# Patient Record
Sex: Male | Born: 1955 | Race: White | Hispanic: No | Marital: Single | Smoking: Former smoker
Health system: Southern US, Community
[De-identification: ages and names within clinical notes are randomized; demographics above are authoritative.]

## PROBLEM LIST (undated history)

## (undated) DIAGNOSIS — C801 Malignant (primary) neoplasm, unspecified: Secondary | ICD-10-CM

## (undated) DIAGNOSIS — K7460 Unspecified cirrhosis of liver: Secondary | ICD-10-CM

## (undated) DIAGNOSIS — C169 Malignant neoplasm of stomach, unspecified: Secondary | ICD-10-CM

## (undated) DIAGNOSIS — C229 Malignant neoplasm of liver, not specified as primary or secondary: Secondary | ICD-10-CM

## (undated) DIAGNOSIS — B1920 Unspecified viral hepatitis C without hepatic coma: Secondary | ICD-10-CM

## (undated) HISTORY — PX: LIVER BIOPSY: SHX301

---

## 2013-09-29 ENCOUNTER — Emergency Department (HOSPITAL_COMMUNITY): Payer: Medicaid Other

## 2013-09-29 ENCOUNTER — Encounter (HOSPITAL_COMMUNITY): Payer: Self-pay | Admitting: Emergency Medicine

## 2013-09-29 ENCOUNTER — Inpatient Hospital Stay (HOSPITAL_COMMUNITY)
Admission: EM | Admit: 2013-09-29 | Discharge: 2013-10-21 | DRG: 871 | Disposition: E | Payer: Medicaid Other | Attending: Internal Medicine | Admitting: Internal Medicine

## 2013-09-29 DIAGNOSIS — R740 Nonspecific elevation of levels of transaminase and lactic acid dehydrogenase [LDH]: Secondary | ICD-10-CM

## 2013-09-29 DIAGNOSIS — C7800 Secondary malignant neoplasm of unspecified lung: Secondary | ICD-10-CM | POA: Diagnosis present

## 2013-09-29 DIAGNOSIS — C23 Malignant neoplasm of gallbladder: Secondary | ICD-10-CM | POA: Diagnosis present

## 2013-09-29 DIAGNOSIS — R6521 Severe sepsis with septic shock: Secondary | ICD-10-CM

## 2013-09-29 DIAGNOSIS — E872 Acidosis, unspecified: Secondary | ICD-10-CM | POA: Diagnosis present

## 2013-09-29 DIAGNOSIS — C801 Malignant (primary) neoplasm, unspecified: Secondary | ICD-10-CM

## 2013-09-29 DIAGNOSIS — A419 Sepsis, unspecified organism: Principal | ICD-10-CM | POA: Diagnosis present

## 2013-09-29 DIAGNOSIS — B1921 Unspecified viral hepatitis C with hepatic coma: Secondary | ICD-10-CM | POA: Diagnosis present

## 2013-09-29 DIAGNOSIS — R188 Other ascites: Secondary | ICD-10-CM | POA: Diagnosis present

## 2013-09-29 DIAGNOSIS — Z6825 Body mass index (BMI) 25.0-25.9, adult: Secondary | ICD-10-CM

## 2013-09-29 DIAGNOSIS — J9600 Acute respiratory failure, unspecified whether with hypoxia or hypercapnia: Secondary | ICD-10-CM | POA: Diagnosis present

## 2013-09-29 DIAGNOSIS — D696 Thrombocytopenia, unspecified: Secondary | ICD-10-CM | POA: Diagnosis present

## 2013-09-29 DIAGNOSIS — E875 Hyperkalemia: Secondary | ICD-10-CM

## 2013-09-29 DIAGNOSIS — R5381 Other malaise: Secondary | ICD-10-CM

## 2013-09-29 DIAGNOSIS — Z87891 Personal history of nicotine dependence: Secondary | ICD-10-CM | POA: Diagnosis not present

## 2013-09-29 DIAGNOSIS — R627 Adult failure to thrive: Secondary | ICD-10-CM | POA: Diagnosis present

## 2013-09-29 DIAGNOSIS — R5383 Other fatigue: Secondary | ICD-10-CM

## 2013-09-29 DIAGNOSIS — Z515 Encounter for palliative care: Secondary | ICD-10-CM

## 2013-09-29 DIAGNOSIS — C7931 Secondary malignant neoplasm of brain: Secondary | ICD-10-CM | POA: Diagnosis present

## 2013-09-29 DIAGNOSIS — K7460 Unspecified cirrhosis of liver: Secondary | ICD-10-CM | POA: Diagnosis present

## 2013-09-29 DIAGNOSIS — E43 Unspecified severe protein-calorie malnutrition: Secondary | ICD-10-CM | POA: Diagnosis present

## 2013-09-29 DIAGNOSIS — C799 Secondary malignant neoplasm of unspecified site: Secondary | ICD-10-CM

## 2013-09-29 DIAGNOSIS — I214 Non-ST elevation (NSTEMI) myocardial infarction: Secondary | ICD-10-CM | POA: Diagnosis present

## 2013-09-29 DIAGNOSIS — C787 Secondary malignant neoplasm of liver and intrahepatic bile duct: Secondary | ICD-10-CM | POA: Diagnosis present

## 2013-09-29 DIAGNOSIS — K769 Liver disease, unspecified: Secondary | ICD-10-CM

## 2013-09-29 DIAGNOSIS — K7200 Acute and subacute hepatic failure without coma: Secondary | ICD-10-CM | POA: Diagnosis present

## 2013-09-29 DIAGNOSIS — N179 Acute kidney failure, unspecified: Secondary | ICD-10-CM | POA: Diagnosis present

## 2013-09-29 DIAGNOSIS — K7290 Hepatic failure, unspecified without coma: Secondary | ICD-10-CM

## 2013-09-29 DIAGNOSIS — R7401 Elevation of levels of liver transaminase levels: Secondary | ICD-10-CM

## 2013-09-29 DIAGNOSIS — E162 Hypoglycemia, unspecified: Secondary | ICD-10-CM | POA: Diagnosis present

## 2013-09-29 DIAGNOSIS — C7949 Secondary malignant neoplasm of other parts of nervous system: Secondary | ICD-10-CM

## 2013-09-29 DIAGNOSIS — R6520 Severe sepsis without septic shock: Secondary | ICD-10-CM

## 2013-09-29 DIAGNOSIS — Z66 Do not resuscitate: Secondary | ICD-10-CM | POA: Diagnosis present

## 2013-09-29 DIAGNOSIS — J9601 Acute respiratory failure with hypoxia: Secondary | ICD-10-CM | POA: Diagnosis present

## 2013-09-29 DIAGNOSIS — R0602 Shortness of breath: Secondary | ICD-10-CM | POA: Diagnosis present

## 2013-09-29 DIAGNOSIS — R531 Weakness: Secondary | ICD-10-CM

## 2013-09-29 HISTORY — DX: Unspecified viral hepatitis C without hepatic coma: B19.20

## 2013-09-29 HISTORY — DX: Malignant neoplasm of stomach, unspecified: C16.9

## 2013-09-29 HISTORY — DX: Malignant (primary) neoplasm, unspecified: C80.1

## 2013-09-29 HISTORY — DX: Unspecified cirrhosis of liver: K74.60

## 2013-09-29 HISTORY — DX: Malignant neoplasm of liver, not specified as primary or secondary: C22.9

## 2013-09-29 LAB — CBC WITH DIFFERENTIAL/PLATELET
BASOS PCT: 1 % (ref 0–1)
Basophils Absolute: 0.1 10*3/uL (ref 0.0–0.1)
EOS PCT: 0 % (ref 0–5)
Eosinophils Absolute: 0 10*3/uL (ref 0.0–0.7)
HCT: 38.5 % — ABNORMAL LOW (ref 39.0–52.0)
HEMOGLOBIN: 12.9 g/dL — AB (ref 13.0–17.0)
LYMPHS PCT: 6 % — AB (ref 12–46)
Lymphs Abs: 0.7 10*3/uL (ref 0.7–4.0)
MCH: 38.1 pg — ABNORMAL HIGH (ref 26.0–34.0)
MCHC: 33.5 g/dL (ref 30.0–36.0)
MCV: 113.6 fL — ABNORMAL HIGH (ref 78.0–100.0)
MONOS PCT: 14 % — AB (ref 3–12)
Monocytes Absolute: 1.7 10*3/uL — ABNORMAL HIGH (ref 0.1–1.0)
NEUTROS PCT: 79 % — AB (ref 43–77)
Neutro Abs: 9.3 10*3/uL — ABNORMAL HIGH (ref 1.7–7.7)
Platelets: 57 10*3/uL — ABNORMAL LOW (ref 150–400)
RBC: 3.39 MIL/uL — ABNORMAL LOW (ref 4.22–5.81)
RDW: 19.3 % — ABNORMAL HIGH (ref 11.5–15.5)
WBC: 11.8 10*3/uL — AB (ref 4.0–10.5)

## 2013-09-29 LAB — BODY FLUID CELL COUNT WITH DIFFERENTIAL
Eos, Fluid: 0 %
LYMPHS FL: 34 %
MONOCYTE-MACROPHAGE-SEROUS FLUID: 63 % (ref 50–90)
Neutrophil Count, Fluid: 3 % (ref 0–25)
WBC FLUID: 422 uL (ref 0–1000)

## 2013-09-29 LAB — CBC
HEMATOCRIT: 32.7 % — AB (ref 39.0–52.0)
Hemoglobin: 10.8 g/dL — ABNORMAL LOW (ref 13.0–17.0)
MCH: 37.9 pg — AB (ref 26.0–34.0)
MCHC: 33 g/dL (ref 30.0–36.0)
MCV: 114.7 fL — ABNORMAL HIGH (ref 78.0–100.0)
Platelets: 34 10*3/uL — ABNORMAL LOW (ref 150–400)
RBC: 2.85 MIL/uL — ABNORMAL LOW (ref 4.22–5.81)
RDW: 19.4 % — ABNORMAL HIGH (ref 11.5–15.5)
WBC: 11 10*3/uL — AB (ref 4.0–10.5)

## 2013-09-29 LAB — COMPREHENSIVE METABOLIC PANEL
ALBUMIN: 2.5 g/dL — AB (ref 3.5–5.2)
ALK PHOS: 147 U/L — AB (ref 39–117)
ALT: 503 U/L — AB (ref 0–53)
ALT: 595 U/L — AB (ref 0–53)
AST: 1437 U/L — AB (ref 0–37)
AST: 1866 U/L — ABNORMAL HIGH (ref 0–37)
Albumin: 3.1 g/dL — ABNORMAL LOW (ref 3.5–5.2)
Alkaline Phosphatase: 194 U/L — ABNORMAL HIGH (ref 39–117)
Anion gap: 33 — ABNORMAL HIGH (ref 5–15)
Anion gap: 32 — ABNORMAL HIGH (ref 5–15)
BUN: 61 mg/dL — ABNORMAL HIGH (ref 6–23)
BUN: 60 mg/dL — ABNORMAL HIGH (ref 6–23)
CHLORIDE: 85 meq/L — AB (ref 96–112)
CO2: 14 meq/L — AB (ref 19–32)
CO2: 12 mEq/L — ABNORMAL LOW (ref 19–32)
Calcium: 10.2 mg/dL (ref 8.4–10.5)
Calcium: 9.7 mg/dL (ref 8.4–10.5)
Chloride: 89 mEq/L — ABNORMAL LOW (ref 96–112)
Creatinine, Ser: 1.96 mg/dL — ABNORMAL HIGH (ref 0.50–1.35)
Creatinine, Ser: 2.06 mg/dL — ABNORMAL HIGH (ref 0.50–1.35)
GFR calc Af Amer: 42 mL/min — ABNORMAL LOW (ref >90)
GFR, EST AFRICAN AMERICAN: 40 mL/min — AB (ref >90)
GFR, EST NON AFRICAN AMERICAN: 36 mL/min — AB (ref >90)
GFR, EST NON AFRICAN AMERICAN: 34 mL/min — AB (ref >90)
GLUCOSE: 106 mg/dL — AB (ref 70–99)
Glucose, Bld: 32 mg/dL — CL (ref 70–99)
POTASSIUM: 6.7 meq/L — AB (ref 3.7–5.3)
Potassium: 6.9 mEq/L (ref 3.7–5.3)
SODIUM: 132 meq/L — AB (ref 137–147)
SODIUM: 133 meq/L — AB (ref 137–147)
Total Bilirubin: 18.3 mg/dL (ref 0.3–1.2)
Total Bilirubin: 17.8 mg/dL — ABNORMAL HIGH (ref 0.3–1.2)
Total Protein: 7.6 g/dL (ref 6.0–8.3)
Total Protein: 6.8 g/dL (ref 6.0–8.3)

## 2013-09-29 LAB — PROTIME-INR
INR: 4.51 — ABNORMAL HIGH (ref 0.00–1.49)
Prothrombin Time: 42.8 seconds — ABNORMAL HIGH (ref 11.6–15.2)

## 2013-09-29 LAB — BASIC METABOLIC PANEL
Anion gap: 24 — ABNORMAL HIGH (ref 5–15)
BUN: 61 mg/dL — AB (ref 6–23)
CO2: 18 mEq/L — ABNORMAL LOW (ref 19–32)
Calcium: 9.8 mg/dL (ref 8.4–10.5)
Chloride: 89 mEq/L — ABNORMAL LOW (ref 96–112)
Creatinine, Ser: 2.3 mg/dL — ABNORMAL HIGH (ref 0.50–1.35)
GFR calc Af Amer: 35 mL/min — ABNORMAL LOW (ref >90)
GFR, EST NON AFRICAN AMERICAN: 30 mL/min — AB (ref >90)
Glucose, Bld: 156 mg/dL — ABNORMAL HIGH (ref 70–99)
POTASSIUM: 6 meq/L — AB (ref 3.7–5.3)
SODIUM: 131 meq/L — AB (ref 137–147)

## 2013-09-29 LAB — BLOOD GAS, ARTERIAL
Acid-base deficit: 12.1 mmol/L — ABNORMAL HIGH (ref 0.0–2.0)
BICARBONATE: 12.7 meq/L — AB (ref 20.0–24.0)
DRAWN BY: 36496
FIO2: 1 %
O2 SAT: 100 %
PCO2 ART: 26.3 mmHg — AB (ref 35.0–45.0)
PO2 ART: 326 mmHg — AB (ref 80.0–100.0)
Patient temperature: 98.6
TCO2: 11.8 mmol/L (ref 0–100)
pH, Arterial: 7.303 — ABNORMAL LOW (ref 7.350–7.450)

## 2013-09-29 LAB — ALBUMIN, FLUID (OTHER): Albumin, Fluid: 0.4 g/dL

## 2013-09-29 LAB — TROPONIN I
Troponin I: 0.63 ng/mL (ref <0.30)
Troponin I: 1.24 ng/mL (ref <0.30)
Troponin I: 1.49 ng/mL (ref <0.30)

## 2013-09-29 LAB — MRSA PCR SCREENING: MRSA by PCR: NEGATIVE

## 2013-09-29 LAB — PHOSPHORUS: Phosphorus: 7 mg/dL — ABNORMAL HIGH (ref 2.3–4.6)

## 2013-09-29 LAB — CBG MONITORING, ED: GLUCOSE-CAPILLARY: 140 mg/dL — AB (ref 70–99)

## 2013-09-29 LAB — GLUCOSE, CAPILLARY
GLUCOSE-CAPILLARY: 108 mg/dL — AB (ref 70–99)
GLUCOSE-CAPILLARY: 191 mg/dL — AB (ref 70–99)
Glucose-Capillary: 135 mg/dL — ABNORMAL HIGH (ref 70–99)
Glucose-Capillary: 159 mg/dL — ABNORMAL HIGH (ref 70–99)

## 2013-09-29 LAB — PROTEIN, BODY FLUID: Total protein, fluid: 0.8 g/dL

## 2013-09-29 LAB — HEMATOCRIT, BODY FLUID: Hematocrit, Fluid: 3 %

## 2013-09-29 LAB — TYPE AND SCREEN
ABO/RH(D): A NEG
Antibody Screen: NEGATIVE

## 2013-09-29 LAB — AMMONIA: Ammonia: 82 umol/L — ABNORMAL HIGH (ref 11–60)

## 2013-09-29 LAB — I-STAT CG4 LACTIC ACID, ED: LACTIC ACID, VENOUS: 16.75 mmol/L — AB (ref 0.5–2.2)

## 2013-09-29 LAB — ABO/RH: ABO/RH(D): A NEG

## 2013-09-29 LAB — MAGNESIUM: Magnesium: 3.3 mg/dL — ABNORMAL HIGH (ref 1.5–2.5)

## 2013-09-29 LAB — LACTIC ACID, PLASMA: Lactic Acid, Venous: 11.1 mmol/L — ABNORMAL HIGH (ref 0.5–2.2)

## 2013-09-29 MED ORDER — ONDANSETRON HCL 4 MG/2ML IJ SOLN
4.0000 mg | Freq: Four times a day (QID) | INTRAMUSCULAR | Status: DC | PRN
  Administered 2013-09-29: 4 mg via INTRAVENOUS
  Filled 2013-09-29: qty 2

## 2013-09-29 MED ORDER — LACTULOSE 10 GM/15ML PO SOLN
20.0000 g | Freq: Two times a day (BID) | ORAL | Status: DC
  Administered 2013-09-29: 20 g via ORAL
  Filled 2013-09-29: qty 30

## 2013-09-29 MED ORDER — SODIUM CHLORIDE 0.9 % IV SOLN: 250.0000 mL | INTRAVENOUS | Status: DC | PRN

## 2013-09-29 MED ORDER — VANCOMYCIN HCL IN DEXTROSE 1-5 GM/200ML-% IV SOLN
1000.0000 mg | Freq: Once | INTRAVENOUS | Status: AC
  Administered 2013-09-29: 1000 mg via INTRAVENOUS
  Filled 2013-09-29: qty 200

## 2013-09-29 MED ORDER — INSULIN ASPART 100 UNIT/ML ~~LOC~~ SOLN
1.0000 [IU] | SUBCUTANEOUS | Status: DC
  Administered 2013-09-29 (×2): 2 [IU] via SUBCUTANEOUS
  Administered 2013-09-29: 1 [IU] via SUBCUTANEOUS
  Administered 2013-09-29: 2 [IU] via SUBCUTANEOUS
  Administered 2013-09-30 (×2): 1 [IU] via SUBCUTANEOUS
  Administered 2013-09-30: 2 [IU] via SUBCUTANEOUS

## 2013-09-29 MED ORDER — ENSURE COMPLETE PO LIQD
237.0000 mL | Freq: Three times a day (TID) | ORAL | Status: DC
  Administered 2013-09-29 – 2013-09-30 (×5): 237 mL via ORAL

## 2013-09-29 MED ORDER — PIPERACILLIN-TAZOBACTAM 3.375 G IVPB 30 MIN
3.3750 g | Freq: Once | INTRAVENOUS | Status: AC
  Administered 2013-09-29: 3.375 g via INTRAVENOUS
  Filled 2013-09-29: qty 50

## 2013-09-29 MED ORDER — CETYLPYRIDINIUM CHLORIDE 0.05 % MT LIQD
7.0000 mL | Freq: Two times a day (BID) | OROMUCOSAL | Status: DC
  Administered 2013-09-29 – 2013-09-30 (×3): 7 mL via OROMUCOSAL

## 2013-09-29 MED ORDER — SODIUM CHLORIDE 0.9 % IV BOLUS (SEPSIS)
2000.0000 mL | Freq: Once | INTRAVENOUS | Status: AC
  Administered 2013-09-29: 2000 mL via INTRAVENOUS

## 2013-09-29 MED ORDER — SODIUM CHLORIDE 0.9 % IV BOLUS (SEPSIS)
30.0000 mL/kg | Freq: Once | INTRAVENOUS | Status: AC
  Administered 2013-09-29: 1000 mL via INTRAVENOUS

## 2013-09-29 MED ORDER — DEXTROSE 50 % IV SOLN
1.0000 | Freq: Once | INTRAVENOUS | Status: AC
  Administered 2013-09-29: 50 mL via INTRAVENOUS

## 2013-09-29 MED ORDER — FENTANYL CITRATE 0.05 MG/ML IJ SOLN
25.0000 ug | INTRAMUSCULAR | Status: DC | PRN
  Administered 2013-09-30: 25 ug via INTRAVENOUS
  Filled 2013-09-29: qty 2

## 2013-09-29 MED ORDER — SODIUM CHLORIDE 0.9 % IV SOLN
1.0000 g | Freq: Once | INTRAVENOUS | Status: AC
  Administered 2013-09-29: 1 g via INTRAVENOUS
  Filled 2013-09-29: qty 10

## 2013-09-29 MED ORDER — HYDROMORPHONE HCL PF 1 MG/ML IJ SOLN: 1.0000 mg | INTRAMUSCULAR | Status: DC | PRN

## 2013-09-29 MED ORDER — SODIUM CHLORIDE 0.9 % IJ SOLN
10.0000 mL | Freq: Two times a day (BID) | INTRAMUSCULAR | Status: DC
  Administered 2013-09-29 – 2013-09-30 (×3): 10 mL

## 2013-09-29 MED ORDER — SODIUM CHLORIDE 0.9 % IJ SOLN: 10.0000 mL | INTRAMUSCULAR | Status: DC | PRN

## 2013-09-29 MED ORDER — INSULIN ASPART 100 UNIT/ML ~~LOC~~ SOLN
10.0000 [IU] | Freq: Once | SUBCUTANEOUS | Status: AC
  Administered 2013-09-29: 10 [IU] via INTRAVENOUS
  Filled 2013-09-29: qty 1

## 2013-09-29 MED ORDER — SODIUM CHLORIDE 0.9 % IV SOLN
1.0000 g | Freq: Two times a day (BID) | INTRAVENOUS | Status: DC
  Administered 2013-09-29 – 2013-10-01 (×5): 1 g via INTRAVENOUS
  Filled 2013-09-29 (×5): qty 1

## 2013-09-29 MED ORDER — DEXTROSE-NACL 5-0.9 % IV SOLN
INTRAVENOUS | Status: DC
  Administered 2013-09-29 – 2013-09-30 (×4): via INTRAVENOUS

## 2013-09-29 MED ORDER — SODIUM CHLORIDE 0.9 % IV SOLN: 1000.0000 mL | INTRAVENOUS | Status: DC

## 2013-09-29 MED ORDER — PIPERACILLIN-TAZOBACTAM 3.375 G IVPB: 3.3750 g | Freq: Three times a day (TID) | INTRAVENOUS | Status: DC

## 2013-09-29 MED ORDER — LACTULOSE 10 GM/15ML PO SOLN
20.0000 g | Freq: Every day | ORAL | Status: DC
  Administered 2013-09-30: 20 g via ORAL
  Filled 2013-09-29 (×2): qty 30

## 2013-09-29 MED ORDER — PANTOPRAZOLE SODIUM 40 MG IV SOLR
40.0000 mg | INTRAVENOUS | Status: DC
  Administered 2013-09-29: 40 mg via INTRAVENOUS
  Filled 2013-09-29: qty 40

## 2013-09-29 MED ORDER — SODIUM CHLORIDE 0.9 % IV SOLN
10.0000 mL/h | Freq: Once | INTRAVENOUS | Status: AC
  Administered 2013-09-29: 10 mL/h via INTRAVENOUS

## 2013-09-29 MED ORDER — CHLORHEXIDINE GLUCONATE 0.12 % MT SOLN
15.0000 mL | Freq: Two times a day (BID) | OROMUCOSAL | Status: DC
  Administered 2013-09-29 – 2013-09-30 (×4): 15 mL via OROMUCOSAL
  Filled 2013-09-29 (×7): qty 15

## 2013-09-29 MED ORDER — SODIUM POLYSTYRENE SULFONATE 15 GM/60ML PO SUSP
60.0000 g | Freq: Once | ORAL | Status: AC
  Administered 2013-09-29: 60 g via ORAL
  Filled 2013-09-29: qty 240

## 2013-09-29 MED ORDER — ALBUMIN HUMAN 25 % IV SOLN
50.0000 g | Freq: Once | INTRAVENOUS | Status: AC
  Administered 2013-09-29: 50 g via INTRAVENOUS
  Filled 2013-09-29: qty 200

## 2013-09-29 MED ORDER — VANCOMYCIN HCL IN DEXTROSE 750-5 MG/150ML-% IV SOLN: 750.0000 mg | Freq: Two times a day (BID) | INTRAVENOUS | Status: DC

## 2013-09-29 MED ORDER — MORPHINE SULFATE 2 MG/ML IJ SOLN
2.0000 mg | Freq: Once | INTRAMUSCULAR | Status: AC
  Administered 2013-09-29: 2 mg via INTRAVENOUS
  Filled 2013-09-29: qty 1

## 2013-09-29 MED ORDER — DEXTROSE 50 % IV SOLN
INTRAVENOUS | Status: AC
  Filled 2013-09-29: qty 50

## 2013-09-29 MED ORDER — OXYCODONE HCL 5 MG PO TABS
10.0000 mg | ORAL_TABLET | ORAL | Status: DC | PRN
  Administered 2013-09-29: 10 mg via ORAL
  Filled 2013-09-29 (×3): qty 2

## 2013-09-29 NOTE — Progress Notes (Addendum)
ANTIBIOTIC CONSULT NOTE - INITIAL  Pharmacy Consult for Zosyn and Vancomycin x1 Ed Meropenem Indication: Intra-abdominal infection  No Known Allergies  Patient Measurements: Height: 5' 10.5" (179.1 cm) Weight: 160 lb (72.576 kg) IBW/kg (Calculated) : 74.15   Vital Signs: BP: 102/61 mmHg (08/10 0215) Pulse Rate: 95 (08/10 0215) Intake/Output from previous day:   Intake/Output from this shift:    Labs:  Recent Labs  09/25/2013 0112  WBC 11.8*  HGB 12.9*  PLT 57*  CREATININE 1.96*   Estimated Creatinine Clearance: 43.2 ml/min (by C-G formula based on Cr of 1.96). No results found for this basename: VANCOTROUGH, VANCOPEAK, VANCORANDOM, GENTTROUGH, GENTPEAK, GENTRANDOM, TOBRATROUGH, TOBRAPEAK, TOBRARND, AMIKACINPEAK, AMIKACINTROU, AMIKACIN,  in the last 72 hours   Microbiology: No results found for this or any previous visit (from the past 720 hour(s)).  Medical History: Past Medical History  Diagnosis Date  . Cancer   . Cirrhosis of liver   . Hepatitis C   . Liver cancer     Stage IV  . 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 dextrose      . vancomycin  750 mg Intravenous Q12H   Infusions:  . calcium gluconate 1 GM IV 1 g (09/26/2013 0250)  . dextrose 5 % and 0.9% NaCl 125 mL/hr at 10/12/2013 0237  . piperacillin-tazobactam (ZOSYN)  IV     Assessment: 58 yo with hx of cirrhosis, hep C and recent diagnosis of stage IV Ca with mets to liver. Admitted with respiratory distress.  Zosyn and Vancomycin x1 Ed. Meropenem per Rx for intra-abdominal infection.  Goal of Therapy:  Treat infection  Plan:   Meropenem 1Gm IV q12h  F/U SCr/levels/cultures as needed  Lawana Pai R 10/14/2013,2:51 AM

## 2013-09-29 NOTE — Progress Notes (Signed)
eLink Physician-Brief Progress Note Patient Name: Todd Mcdowell DOB: 06/27/1955 MRN: [id number]  Date of Service  09/22/2013   HPI/Events of Note  Persistent hyperkalemia with K of 6.7 despite calcium gluconate/insulin/dextrose   eICU Interventions  Plan 60 gm kayexalate po times one dose   Intervention Category Major Interventions: Electrolyte abnormality - evaluation and management  DETERDING,ELIZABETH 10/07/2013, 6:24 AM

## 2013-09-29 NOTE — ED Notes (Signed)
Bed: RESB Expected date:  Expected time:  Means of arrival:  Comments: EMS/57 yo male with liver cancer with mets to lungs-78% NRB

## 2013-09-29 NOTE — Procedures (Signed)
Parecentesis Procedure Note  Pre-operative Diagnosis: Large ascites  Post-operative Diagnosis: Same  Indications: Large ascites causing dyspnea  Procedure Details  Consent: Informed consent was obtained. Risks of the procedure were discussed including: infection, bleeding, pain, puncture of intraabdominal organ  Under sterile conditions the patient was positioned. Betadine solution and sterile drapes were utilized.  1% buffered lidocaine was used to anesthetize the RLQ abdominal area. Fluid was obtained without any difficulties and minimal blood loss.  A dressing was applied to the wound and wound care instructions were provided.   Findings 3400 ml of bloody peritoneal fluid was obtained. A sample was sent to Pathology for cytogenetics, flow, and cell counts, as well as for infection analysis.  Complications:  None; patient tolerated the procedure well.          Condition: stable  Plan Follow up fluid analysis Give FFP and albumin

## 2013-09-29 NOTE — Progress Notes (Signed)
INITIAL NUTRITION ASSESSMENT  Pt meets criteria for severe MALNUTRITION in the context of chronic illness as evidenced by severe wasting in upper body and severe fluid accumulation.  DOCUMENTATION CODES Per approved criteria  -Severe malnutrition in the context of chronic illness    INTERVENTION: - Ensure Complete TID - Ordered snacks per pt preferences - RD to monitor plan of care  NUTRITION DIAGNOSIS: Inadequate oral intake related to poor appetite as evidenced by son's report.   Goal: Nutrition per goals of palliative care discussion  Monitor:  Weights, labs, intake, goals of care  Reason for Assessment: Malnutrition screening tool   58 y.o. male  Admitting Dx: Dyspnea   ASSESSMENT: Pt with history of cirrhosis, hepatitis C, and recent diagnosis of stage IV cancer with metastases to liver and lungs and suspicion of either brain mets vs embolic CVA who presented with respiratory distress. Pt jaundiced and some wasting in upper body. Palliative care has been consulted.   - Pt kept eyes closed during most of visit - Son and multiple family members present at bedside - Son reports pt has had poor appetite for the past 2-3 days due to having a lot of fluid on him - Usually eats frosted flakes for breakfast, a sandwich for lunch, and snacks on a 200 calorie yogurt with added vitamins and minerals in addition to Ensure - Son states pt ate a good a breakfast - Pt appeared too lethargic for nutrition focused physical exam  Potassium elevated BUN/Cr elevated with low GFR Alk phos elevated Phosphorus and magnesium elevated  Ammonia and total bilirubin elevated AST/ALT significantly elevated    Height: Ht Readings from Last 1 Encounters:  10/07/2013 $RemoveB'5\' 10"'fMvnWQXM$  (1.778 m)    Weight: Wt Readings from Last 1 Encounters:  09/20/2013 169 lb 12.1 oz (77 kg)    Ideal Body Weight: 166 lbs   % Ideal Body Weight: 102%  Wt Readings from Last 10 Encounters:  09/25/2013 169 lb 12.1 oz (77  kg)    Usual Body Weight: Unsure    BMI:  Body mass index is 24.36 kg/(m^2). *not accurate due to ascites and +3 edema  Estimated Nutritional Needs: Kcal: 2200-2400 Protein: 105-125g Fluid: per MD  Skin: Gross ascites, jaundiced, +3 RLE, LLE edema  Diet Order: General  EDUCATION NEEDS: -No education needs identified at this time   Intake/Output Summary (Last 24 hours) at 10/18/2013 1140 Last data filed at 10/18/2013 1055  Gross per 24 hour  Intake 1455.08 ml  Output   3400 ml  Net -1944.92 ml    Last BM: PTA  Labs:   Recent Labs Lab 10/11/2013 0112 10/08/2013 0525  NA 132* 133*  K 6.9* 6.7*  CL 85* 89*  CO2 14* 12*  BUN 61* 60*  CREATININE 1.96* 2.06*  CALCIUM 10.2 9.7  MG  --  3.3*  PHOS  --  7.0*  GLUCOSE 32* 106*    CBG (last 3)   Recent Labs  09/26/2013 0301 10/04/2013 0637 09/24/2013 0804  GLUCAP 140* 135* 108*    Scheduled Meds: . antiseptic oral rinse  7 mL Mouth Rinse BID  . chlorhexidine  15 mL Mouth Rinse BID  . insulin aspart  1-3 Units Subcutaneous 6 times per day  . lactulose  20 g Oral BID  . meropenem (MERREM) IV  1 g Intravenous Q12H  . pantoprazole (PROTONIX) IV  40 mg Intravenous Q24H  . sodium chloride  10-40 mL Intracatheter Q12H    Continuous Infusions: . dextrose 5 %  and 0.9% NaCl 125 mL/hr at 09/28/2013 5885    Past Medical History  Diagnosis Date  . Cancer   . Cirrhosis of liver   . Hepatitis C   . Liver cancer     Stage IV  . 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 Liver biopsy      Carlis Stable MS, RD, LDN [phone] Pager [phone] Weekend/After Hours Pager

## 2013-09-29 NOTE — ED Notes (Signed)
Pt arrives by EMS with complaints of respiratory distress.  Pt with recent diagnosis of lung cancer with mets to liver at [facility] no previous medical care prior to this. Pt states he saw another doctor in [facility] 3 days ago.  Pt initially called EMS yesterday AM at 1030 but then declined transport.  Progressively short of breath tonight and called 911 again.  Pt alert, cachectic, with bruising over abdomen, pt extremely jaundiced-initial sat per EMS on arrival to pt's home-80% and progressively decreased to 65%-currently 99-100% on NRB.  Pt is awake and alert at this time.

## 2013-09-29 NOTE — Consult Note (Signed)
Patient Todd Mcdowell      DOB: [birth year]      JGO:[id number]     Consult Note from the Palliative Medicine Team at [facility] Requested by: Dr Elsworth Soho     PCP: No primary provider on file. Reason for Consultation: Clarification of GOC and options     Phone Number:None  Assessment of patients Current state:  Continued physical and functional and cognitive decline 2/2 to recently diagnosed cirrhosis, concern for liver/lung cancer (no biopsy confirmation), per CT scans  Patient and family faced with advanced directive decisions and anticipatory care needs  Consult is for review of medical treatment options, clarification of goals of care and end of life issues, disposition and options, and symptom recommendation.  This NP Wadie Lessen reviewed medical records, received report from team, assessed the patient and then meet at the patient's bedside along with his two sons  to discuss diagnosis prognosis, [place], EOL wishes disposition and options.  A detailed discussion was had today regarding advanced directives.  Concepts specific to code status, artifical feeding and hydration, continued IV antibiotics and rehospitalization was had.  The difference between a aggressive medical intervention path  and a palliative comfort care path for this patient at this time was had.  Values and goals of care important to patient and family were attempted to be elicited.  Concept of Hospice and Palliative Care were discussed  Natural trajectory and expectations at EOL were discussed.  Questions and concerns addressed.  Hard Choices booklet left for review. Family encouraged to call with questions or concerns.  PMT will continue to support holistically.   Goals of Care: 1.  Code Status:DNR/DNI   2. Scope of Treatment:  Continue present medical interventions in hopes of stabilization.  Patient and family need  time to process the diagnosis, prognosis,  and to clarify goals of care.   3.  Disposition:  Dependant on outcomes   4. Symptom Management:   1. Weakness:  2. Pain:  Oxycodone 10 mg every 4 hr prn-as previously prescribed (once GOC establish will make recommendation to enhance comfort)  5. Psychosocial:  Emotional support offered to patient and family at bedside.  The patient has a brother who lives in [place] who plans to come to [facility] to support the patient.  Patient lives with his son who would be unable to care for him in his home on discharge   Patient Documents Completed or Given: Document Given Completed  Advanced Directives Pkt    MOST    DNR    Gone from My Sight    Hard Choices yes     Brief HPI:  58 yo male with a history of cirrhosis, hepatitis C, and recent diagnosis of stage IV cancer with metastases to liver and lungs and suspicion of either brain mets vs embolic CVA who presented with respiratory distress.  He was originally seen at [facility] with this diagnosis and was sent home about 2 days ago. At home he has had progressive shortness of breath and jaundice. .In the ED he has multiple metabolic abnormality (high K, Cr, BUN, LFT, bili, INR), low plt, high lactate, acidotic, low glucose.  Paracentesis for three liters of fluid Used to be a heavy EtOH drinker but quit about a month ago, no known drug use.   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 Ha hgot a port on his chest for possible palliative chemo this week but now he's acutely ill. Had also had a possible UTI in [facility] hospital   Poor prognosis  ROS: weakness, fatigue, weight loss, confusion   PMH:  Past Medical History  Diagnosis Date  . Cancer   . Cirrhosis of liver   . Hepatitis C   . Liver cancer     Stage IV  . 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 Liver biopsy     I have reviewed the [place] and SH and  If appropriate update it with new information. No Known Allergies Scheduled Meds: . antiseptic oral rinse  7 mL Mouth Rinse BID  . chlorhexidine  15 mL Mouth Rinse BID  . feeding supplement (ENSURE COMPLETE)  237 mL Oral TID BM  . insulin aspart  1-3 Units Subcutaneous 6 times per day  . lactulose  20 g Oral BID  . meropenem (MERREM) IV  1 g Intravenous Q12H  . pantoprazole (PROTONIX) IV  40 mg Intravenous Q24H  . sodium chloride  10-40 mL Intracatheter Q12H   Continuous Infusions: . dextrose 5 % and 0.9% NaCl Stopped (10/19/2013 0940)   PRN Meds:.sodium chloride, fentaNYL, ondansetron (ZOFRAN) IV, oxyCODONE, sodium chloride    BP 109/59  Pulse 102  Temp(Src) 96.3 F (35.7 C) (Axillary)  Resp 21  Ht 5\' 10"  (1.778 m)  Wt 77 kg (169 lb 12.1 oz)  BMI 24.36 kg/m2  SpO2 100%   PPS: 30 % at best   Intake/Output Summary (Last 24 hours) at 10/04/2013 1419 Last data filed at 10/19/2013 1416  Gross per 24 hour  Intake 1617.58 ml  Output   3400 ml  Net -1782.42 ml   LBM: 09/30/2013 (on lactulose)                        Physical Exam:  General: ill appearing, weak but engaged in conversation HEENT:  + temporal muscle wasting, sclera is jaundiced  Chest:   Decreased in bases, CTA CVS: tachycardic Abdomen: distended, firm + BS Ext: BLE + 2 edema Skin: jaundice with multiple areas of ecchymosis Neuro:alert and oriented, weak/lethargic  Labs: CBC    Component Value Date/Time   WBC 11.0* 09/28/2013 0525   RBC 2.85* 10/11/2013 0525   HGB 10.8* 10/10/2013 0525   HCT 32.7* 10/04/2013 0525   PLT 34* 09/27/2013 0525   MCV 114.7* 10/05/2013 0525   MCH 37.9* 09/28/2013 0525   MCHC 33.0 10/09/2013 0525   RDW 19.4* 09/25/2013 0525   LYMPHSABS 0.7 09/24/2013 0112   MONOABS 1.7* 10/07/2013 0112   EOSABS 0.0 09/30/2013 0112   BASOSABS 0.1 10/12/2013 0112    BMET    Component Value Date/Time   NA 131*  09/30/2013 1115   K 6.0* 10/19/2013 1115   CL 89* 10/10/2013 1115   CO2 18* 10/15/2013 1115   GLUCOSE 156* 09/27/2013 1115   BUN 61* 10/10/2013 1115   CREATININE 2.30* 09/24/2013 1115   CALCIUM 9.8 09/27/2013 1115   GFRNONAA 30* 10/03/2013 1115   GFRAA 35* 10/18/2013 1115    CMP     Component Value Date/Time   NA 131* 10/18/2013 1115   K 6.0* 10/18/2013 1115   CL 89* 10/12/2013 1115   CO2 18* 10/10/2013 1115  GLUCOSE 156* 09/23/2013 1115   BUN 61* 10/14/2013 1115   CREATININE 2.30* 09/27/2013 1115   CALCIUM 9.8 10/02/2013 1115   PROT 6.8 10/03/2013 0525   ALBUMIN 3.1* 10/03/2013 0525   AST 1866* 10/16/2013 0525   ALT 595* 09/27/2013 0525   ALKPHOS 147* 09/28/2013 0525   BILITOT 17.8* 09/27/2013 0525   GFRNONAA 30* 10/05/2013 1115   GFRAA 35* 09/28/2013 1115      Time In Time Out Total Time Spent with Patient Total Overall Time  1300 1430 80 min 90 min    Greater than 50%  of this time was spent counseling and coordinating care related to the above assessment and plan.   Wadie Lessen NP  Palliative Medicine Team Team Phone # [phone] Pager [phone]

## 2013-09-29 NOTE — Progress Notes (Signed)
PULMONARY / CRITICAL CARE MEDICINE   Name: Todd Mcdowell MRN: [id number] DOB: Jan 23, 1956    ADMISSION DATE:  09/26/2013  REFERRING MD : ED  CHIEF COMPLAINT:  Dyspnea  INITIAL PRESENTATION:   STUDIES:    SIGNIFICANT EVENTS: Paracentesis 8/10 >> 7/25, 7/29 MRI  [facility]) >> 3 new areas are restricted diffusion. Findings are most compatible with acute infarcts     HISTORY OF PRESENT ILLNESS:  This is a 58 yo with a history of cirrhosis, hepatitis C, and recent diagnosis of stage IV cancer with metastases to liver and lungs and suspicion of either brain mets vs embolic CVA who presented with respiratory distress. EMS was called to his house for respiratory distress. He was originally seen at [facility] with this diagnosis and was sent home about 2 days ago. At home he has had progressive shortness of breath and jaundice. Per EMS, saturation was 65%. He was placed on a nonrebreather.In the ED he has multiple metabolic abnormality (high K, Cr, BUN, LFT, bili, INR), low plt, high lactate, acidotic, low glucose. Paracentesis , he felt better after that but about 15 minutes later was complaining of some chest pain, saturation came down from 94% to 92% on room air.  Quit smoking already, used to be a heavy EtOH drinker but quit about a month ago, no known drug abuse. No sick contact, no recent travel.  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 Ha hgot a port on his chest for possible palliative chemo this week but now he's acutely ill. [name] also had a possible UTI in [facility] hospital  SUBJECTIVE: denies pain Afebrile, temp improved On 10L venti mask  VITAL SIGNS: Temp:  [94.5 F (34.7 C)-97.7 F (36.5 C)] 97.1 F (36.2 C) (08/10 0745) Pulse Rate:  [34-109] 104 (08/10 0900) Resp:  [13-41] 24 (08/10 0900) BP: (55-142)/(25-97) 134/62  mmHg (08/10 0900) SpO2:  [80 %-100 %] 96 % (08/10 0900) FiO2 (%):  [40 %-100 %] 40 % (08/10 0900) Weight:  [72.576 kg (160 lb)-77 kg (169 lb 12.1 oz)] 77 kg (169 lb 12.1 oz) (08/10 0450) HEMODYNAMICS:   VENTILATOR SETTINGS: Vent Mode:  [-] BIPAP FiO2 (%):  [40 %-100 %] 40 % Set Rate:  [15 bmp] 15 bmp PEEP:  [6 cmH20] 6 cmH20 Pressure Support:  [6 cmH20] 6 cmH20 INTAKE / OUTPUT:  Intake/Output Summary (Last 24 hours) at 10/03/2013 0908 Last data filed at 10/04/2013 0800  Gross per 24 hour  Intake 934.25 ml  Output   3400 ml  Net -2465.75 ml    PHYSICAL EXAMINATION: General:  Awake, alert, oriented to person only, jaundiced, in moderate distress Neuro:  No focal weakness, no facial droop, speech halting, tongue midline, non focal, oriented to name, place, year, sterexis+ HEENT:  Eyes are jaundiced, EOM full and equal, trachea midline, no stridor Cardiovascular:  RRR, has grade 3/6 systolic murmur Lungs:  Clear, no wheeze, no rales Abdomen:  Globular, nontender, no guarding, has fluid wave Musculoskeletal:  No gross deformities, has bilateral grade 2 leg edema Skin:  Has multiple ecchymosis, has jaundice  LABS:  CBC  Recent Labs Lab 10/19/2013 0112 09/30/2013 0525  WBC 11.8* 11.0*  HGB 12.9* 10.8*  HCT 38.5* 32.7*  PLT 57* 34*   Coag's  Recent Labs Lab 09/30/2013 0112  INR 4.51*   BMET  Recent Labs Lab  10/19/2013 0112 10/20/2013 0525  NA 132* 133*  K 6.9* 6.7*  CL 85* 89*  CO2 14* 12*  BUN 61* 60*  CREATININE 1.96* 2.06*  GLUCOSE 32* 106*   Electrolytes  Recent Labs Lab 10/20/2013 0112 09/22/2013 0525  CALCIUM 10.2 9.7  MG  --  3.3*  PHOS  --  7.0*   Sepsis Markers  Recent Labs Lab 10/05/2013 0123  LATICACIDVEN 16.75*   ABG  Recent Labs Lab 09/20/2013 0112  PHART 7.303*  PCO2ART 26.3*  PO2ART 326.0*   Liver Enzymes  Recent Labs Lab 10/02/2013 0112 10/08/2013 0525  AST 1437* 1866*  ALT 503* 595*  ALKPHOS 194* 147*  BILITOT 18.3* 17.8*  ALBUMIN  2.5* 3.1*   Cardiac Enzymes  Recent Labs Lab 09/30/2013 0525  TROPONINI 0.63*   Glucose  Recent Labs Lab 10/10/2013 0301 10/14/2013 0637 09/26/2013 0804  GLUCAP 140* 135* 108*    Imaging No results found.   ASSESSMENT / PLAN:  PULMONARY A: dyspnea + hypoxia due to restriction by ascites, ? Underlying COPD, ? Hepatopulmonary syndrome Doubt new PE/DVT since auto anticoagaulated. 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 Zosyn + vanco given  In ED, will contionue on meropenem for now Give albumin after paracentesis. Give FFP to correct INR Pain control IV protonix for SUP  HEMATOLOGIC A:  Low platelet likely related to cancer + cirrhosis P:  Transfuse plts if < 10k or active bleed No heparins for now, no antiplatelet  INFECTIOUS A:  Possible SBP. Less likely UTI or PNA but cannot be ruled out at the moment P:   BCx2 pending UC pending Sputum Abx: Meropenem 8/10 >>> Zosyn x 1 dose in ED 8/10 Vanco x 1 dose in ED 8/10  ENDOCRINE A:  Low glucose likely from liver failure. Possibly from sepsis P:   Give dextrose containing fluid, frequent fingerstick checks  NEUROLOGIC A:  Confusion likely from hepatic encephalopathy, may be brain mets contributing if present Recent infarcts noted on MRI P:   Start lactulose +/- Rifaximin   I had an extensive discussion with his sons and explained  the overall very poor prognosis.Son is very reasonable and understanding. DNR issued ,no dailysis, if worsens full comfort  TODAY'S SUMMARY: Would take palliative approach in this man with metastatic cancer, he certainly is not a candidate for HD or chemotherapy at this time  I have personally obtained a history, examined the patient, evaluated laboratory and imaging results, formulated the assessment and plan and placed orders. CRITICAL CARE: The patient is critically ill with multiple organ systems failure and requires high complexity decision making for assessment and support, frequent evaluation and titration of therapies, application of advanced monitoring technologies and extensive interpretation of multiple databases. Critical Care Time devoted to patient care services described in this note is 35  minutes.    Kara Mead MD. Sh[facility] Flood. [facility] Pager [phone] If no response call [phone]   10/05/2013, 9:08 AM

## 2013-09-29 NOTE — Progress Notes (Signed)
CARE MANAGEMENT NOTE 10/14/2013  Patient:  Todd Mcdowell, Todd Mcdowell   Account Number:  [id number]  Date Initiated:  09/28/2013  Documentation initiated by:  DAVIS,RHONDA  Subjective/Objective Assessment:   pt with multiple health problems /58 yo with a history of cirrhosis, hepatitis C, and recent diagnosis of stage IV cancer with metastases to liver and lungs and suspicion of either brain mets vs embolic CVA who presented with respiratory di     Action/Plan:   tbd based on progress and pathology reports   Anticipated DC Date:  10/02/2013   Anticipated DC Plan:  HOME/SELF CARE  In-house referral  Financial Counselor  NA      DC Planning Services  NA      [facility] Choice  NA   Choice offered to / List presented to:  NA   DME arranged  NA      DME agency  NA     [place] arranged  NA      [place] agency  NA   Status of service:  In process, will continue to follow Medicare Important Message given?  NA - LOS <3 / Initial given by admissions (If response is "NO", the following Medicare IM given date fields will be blank) Date Medicare IM given:   Medicare IM given by:   Date Additional Medicare IM given:   Additional Medicare IM given by:    Discharge Disposition:    Per UR Regulation:  Reviewed for med. necessity/level of care/duration of stay  If discussed at [place] of Stay Meetings, dates discussed:    Comments:  10/17/2013/Rhonda Eldridge Dace, BSN, CCM: [phone] Chart reviewed for discharge needs. Next chart review due on [phone]

## 2013-09-29 NOTE — H&P (Addendum)
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 EMS was called to his house for respiratory distress. He was originally seen at [facility] with this diagnosis and was sent home about 2 days ago. At home he has had progressive shortness of breath and jaundice. Per EMS, saturation was 65%. He was placed on a nonrebreather. He denies any chest pain, abdominal pain. No known recent fevers. In the ED he has multiple metabolic abnormality (high K, Cr, BUN, LFT, bili, INR), low plt, high lactate, acidotic, low glucose. Paracentesis done by me in the ED (please see procedure note), he felt better after that but about 15 minutes later was complaining of some chest pain, saturation came down from 94% to 92% on room air.  Quit smoking already, used to be a heavy EtOH drinker but quit about a month ago, no known drug abuse. No sick contact, no recent travel.  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 Ha hgot a port on his chest for possible palliative chemo this week but now he's acutely ill. Todd Mcdowell also had a possible UTI in [facility] hospital   PAST MEDICAL HISTORY :  Past Medical History  Diagnosis Date  . Cancer   . Cirrhosis of liver   . Hepatitis C   . Liver cancer     Stage IV  . 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 Liver biopsy     Prior to Admission medications   Not on File   No Known Allergies  FAMILY HISTORY:  No family history on file. SOCIAL HISTORY:  reports that he quit smoking about 5 months ago. He does not have any smokeless tobacco history on file. He reports that he does not drink alcohol. His drug history is not on file.  REVIEW OF SYSTEMS:  Had some chest pain, no N/V, no diarrhea, no dysuria, no abd pain. Has multiple ecchymosis and purpuric rash. No photosensitivity, no focal weakness, no numbness, no vision changes. Has jaundice, has leg swelling.  SUBJECTIVE:   VITAL SIGNS: Pulse Rate:  [95-99] 95 (08/10 0215) Resp:  [18-27] 26 (08/10 0215) BP: (81-121)/(53-72) 102/61 mmHg (08/10 0215) SpO2:  [80 %-100 %] 100 % (08/10 0215) FiO2 (%):  [40 %-100 %] 40 % (08/10 0135) Weight:  [72.576 kg (160 lb)] 72.576 kg (160 lb) (08/10 0135) HEMODYNAMICS:   VENTILATOR SETTINGS: Vent Mode:  [-] BIPAP FiO2 (%):  [40 %-100 %] 40 % Set Rate:  [15 bmp] 15 bmp PEEP:  [6 cmH20] 6 cmH20 Pressure Support:  [6 cmH20] 6 cmH20 INTAKE / OUTPUT:  Intake/Output Summary (Last 24 hours) at 10/08/2013 0342 Last data filed at 09/25/2013 0308  Gross per 24 hour  Intake      0 ml  Output  3400 ml  Net  -3400 ml    PHYSICAL EXAMINATION: General:  Awake, alert, oriented to person only, jaundiced, in moderate distress Neuro:  No focal weakness, no facial droop, speech normal, tongue midline HEENT:  Eyes are jaundiced, EOM full and equal, trachea midline, no stridor Cardiovascular:  RRR, has grade 3/6 systolic murmur Lungs:  Clear, no wheeze, no rales Abdomen:  Globular, nontender, no guarding, has fluid wave Musculoskeletal:  No gross deformities, has bilateral grade 2 leg edema Skin:  Has multiple ecchymosis, has jaundice  LABS:  CBC  Recent Labs Lab 10/14/2013 0112  WBC 11.8*  HGB 12.9*  HCT 38.5*  PLT 57*   Coag's  Recent Labs Lab 09/21/2013 0112  INR  4.51*   BMET  Recent Labs Lab 09/27/2013 0112  NA 132*  K 6.9*  CL 85*  CO2 14*  BUN 61*  CREATININE 1.96*  GLUCOSE 32*   Electrolytes  Recent Labs Lab 10/17/2013 0112  CALCIUM 10.2   Sepsis Markers  Recent Labs Lab 09/30/2013 0123  LATICACIDVEN 16.75*   ABG  Recent Labs Lab 09/27/2013 0112  PHART 7.303*  PCO2ART 26.3*  PO2ART 326.0*   Liver Enzymes  Recent Labs Lab 09/28/2013 0112  AST 1437*  ALT 503*  ALKPHOS 194*  BILITOT 18.3*  ALBUMIN 2.5*   Cardiac Enzymes No results found for this basename: TROPONINI, PROBNP,  in the last 168 hours Glucose  Recent Labs Lab 10/02/2013 0301  GLUCAP 140*    Imaging No results found.   ASSESSMENT / PLAN:  PULMONARY A: dyspnea + hypoxia due to lungs being pushed up by ascites Also with the new chest pain, currently cannot rule out PE/DVT. 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 Zosyn + vanco given  In ED, will contionue on meropenem for now Give albumin after paracentesis. Give FFP to correct INR Pain control  HEMATOLOGIC A:  Low platelet likely related to cancer + cirrhosis P:   For now will monitor platelet and if lower then 50K and bleeding actively then will give platelet No heparins for now, no antiplatelet  INFECTIOUS A:  Possible SBP. Less likely UTI or PNA but cannot be ruled out at the moment P:   BCx2 pending UC pending Sputum Abx: Meropenem 8/10 >>> Zosyn x 1 dose in ED 8/10 Vanco x 1 dose in ED 8/10  ENDOCRINE A:  Low glucose likely from liver failure. Possibly from sepsis P:   Give dextrose containing fluid, frequent fingerstick checks  NEUROLOGIC A:  Confusion likely from hepatic encephalopathy, may be brain mets contributing if present P:   Check ammonia  level and if elevated then consider starting lactulose +/- Rifaximin MRI head w/ contrast to rule out brain mets  I had an extensive discussion with his sons and explained the overall very poor prognosis. They are very reasonable and understanding. For now they would like his dad to remain full code but will revisit this in the future depending on how the patient does in the next few days.  TODAY'S SUMMARY: dyspnea likely multifactorial: ascites pressing on lungs, possible sepsis, lactic acidosis. Patient with knoen GI tract cancer with liver and lung mets, possible brain mets. S/P paracentesis. Has some chest pain.  I have personally obtained a history, examined the patient, evaluated laboratory and imaging results, formulated the assessment and plan and placed orders. CRITICAL CARE: The patient is critically ill with multiple organ systems failure and requires high complexity decision making for assessment and support, frequent evaluation and titration of therapies, application of advanced monitoring technologies and extensive interpretation of multiple databases. Critical Care Time devoted to patient care services described in this note is 45 minutes.    Pulmonary and [place] Pager: [phone]  09/25/2013, 3:42 AM

## 2013-09-29 NOTE — Progress Notes (Signed)
CRITICAL VALUE ALERT  Critical value received:  K+ 6.7, troponin 0.63  Date of notification:  10/10/2013  Time of notification:  06:15  Critical value read back:Yes.    Nurse who received alert:  Charmayne Sheer  MD notified (1st page):    Time of first page:  06:18  MD notified (2nd page):  Time of second page:  Responding MD: [place]  Time MD responded:  06:18

## 2013-09-29 NOTE — ED Provider Notes (Addendum)
CSN: [id number]     Arrival date & time 10/03/2013  0054 History   First MD Initiated Contact with Patient 09/30/2013 0105     Chief Complaint  Patient presents with  . Respiratory Distress     (Consider location/radiation/quality/duration/timing/severity/associated sxs/prior Treatment) HPI  This is a 58 yo with a history of cirrhosis, hepatitis C, and recent diagnosis of stage IV cancer with metastases to liver who presents with respiratory distress. EMS was called to his house for respiratory distress. He was originally seen at [facility] with this diagnosis. He has had progressive shortness of breath and jaundice. Per EMS, saturation struck to 65%. He was placed on a nonrebreather. On my evaluation, patient is only complaining of shortness of breath. He denies any chest pain, abdominal pain. No known recent fevers. He is on a nonrebreather with increased work of breathing.  Patient wishes to be full code.  Level V caveat for acuity of condition  1:53 AM I have reviewed patient's records from [facility]. 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 He had been referred to palliative oncology and was not felt to be a surgical candidate.  Past Medical History  Diagnosis Date  . Cancer   . Cirrhosis of liver   . Hepatitis C   . Liver cancer     Stage IV   Past Surgical History  Procedure Laterality Date  . Liver biopsy     No family history on file. History  Substance Use Topics  . Smoking status: Former Smoker    Quit date: 04/01/2013  . Smokeless tobacco: Not on file  . Alcohol Use: No     Comment: stopped 2 months ago     Review of Systems  Constitutional: Negative.  Negative for fever.  Respiratory: Positive for shortness of breath. Negative for chest tightness.   Cardiovascular: Negative.  Negative for chest pain.  Gastrointestinal: Positive for abdominal distention. Negative for abdominal pain.       Jaundice  Genitourinary: Negative.   Negative for dysuria.  Musculoskeletal: Negative for back pain.  Skin: Negative for rash.  Neurological: Negative for headaches.  Psychiatric/Behavioral: The patient is not nervous/anxious.   All other systems reviewed and are negative.     Allergies  Review of patient's allergies indicates no known allergies.  Home Medications   Prior to Admission medications   Not on File   BP 104/72  Pulse 97  Resp 27  SpO2 100% Physical Exam  Nursing note and vitals reviewed. Constitutional: He is oriented to person, place, and time.  Chronically ill-appearing, cachectic, increased work of breathing, respiratory distress  HENT:  Head: Normocephalic and atraumatic.  Mucous membranes dry  Eyes: Pupils are equal, round, and reactive to light.  Icteric sclera  Neck: Neck supple.  Cardiovascular: Regular rhythm and normal heart sounds.   No murmur heard. Tachycardia  Pulmonary/Chest: Breath sounds normal. He is in respiratory distress. He has no wheezes.  Increased work of breathing  Abdominal: Soft. Bowel sounds are normal. He exhibits distension. There is no tenderness. There is no rebound and no guarding.  Musculoskeletal: He exhibits edema.  Neurological: He is alert and oriented to person, place, and time.  Skin: Skin is warm and dry.  Bruising noted over the abdomen, jaundiced  Psychiatric: He has a normal mood and affect.    ED Course  Procedures (including critical care time)  CRITICAL CARE Performed by: Thayer Jew, F   Total critical care time: 75 min  Critical care time was exclusive of separately billable procedures and treating other patients.  Critical care was necessary to treat or prevent imminent or life-threatening deterioration.  Critical care was time spent personally by me on the following activities: development of treatment plan with patient and/or surrogate as well as nursing, discussions with consultants, evaluation of patient's response to  treatment, examination of patient, obtaining history from patient or surrogate, ordering and performing treatments and interventions, ordering and review of laboratory studies, ordering and review of radiographic studies, pulse oximetry and re-evaluation of patient's condition.  Labs Review Labs Reviewed  CBC WITH DIFFERENTIAL - Abnormal; Notable for the following:    WBC 11.8 (*)    RBC 3.39 (*)    Hemoglobin 12.9 (*)    HCT 38.5 (*)    MCV 113.6 (*)    MCH 38.1 (*)    RDW 19.3 (*)    All other components within normal limits  BLOOD GAS, ARTERIAL - Abnormal; Notable for the following:    pH, Arterial 7.303 (*)    pCO2 arterial 26.3 (*)    pO2, Arterial 326.0 (*)    Bicarbonate 12.7 (*)    Acid-base deficit 12.1 (*)    All other components within normal limits  PROTIME-INR - Abnormal; Notable for the following:    Prothrombin Time 42.8 (*)    INR 4.51 (*)    All other components within normal limits  I-STAT CG4 LACTIC ACID, ED - Abnormal; Notable for the following:    Lactic Acid, Venous 16.75 (*)    All other components within normal limits  CULTURE, BLOOD (ROUTINE X 2)  CULTURE, BLOOD (ROUTINE X 2)  URINE CULTURE  CBC  COMPREHENSIVE METABOLIC PANEL  URINALYSIS, ROUTINE W REFLEX MICROSCOPIC    Imaging Review No results found.   EKG Interpretation   Date/Time:  Monday September 29 2013 01:08:45 EDT Ventricular Rate:  97 PR Interval:  168 QRS Duration: 123 QT Interval:  400 QTC Calculation: 508 R Axis:   -69 Text Interpretation:  Sinus rhythm Nonspecific IVCD with LAD Inferolateral  infarct, age indeterminate no prior for comparison Confirmed by HORTON   MD, COURTNEY ([id number]) on 10/17/2013 1:16:49 AM      MDM   Final diagnoses:  Septic shock  Metastatic cancer  Hyperkalemia  Hypoglycemia  Transaminitis    Patient presents in respiratory distress. Notably hypoxic requiring a non-reader with increased work of breathing. Also hypotensive with blood pressure in  the 80s. Sepsis alert was initiated. Temperature noted to be 95.  Patient wishes to be full code; however, have expressed to him that I do not feel he will survive to be weaned off a ventilator.  Patient was placed on BiPAP. Patient was given empirical antibiotics and 3 L of fluid was initiated.  Initial lab work notable for lactic acidosis to 16, evidence of liver dysfunction with an INR 4.5, white count of 11.8.  Patient's daughter-in-law is at the bedside. I have requested that she call the patient's next of kin to be at the bedside. I feel the patient's prognosis is poor.  Given that he wishes to be full code, as discussed with critical care who will evaluate.      Merryl Hacker, MD 09/30/2013 [phone]  Lab work has returned. Lab work notable for profound hypoglycemia and hyperkalemia. Patient did have widening of QRS on EKG. No prior for comparison. Patient was given calcium gluconate, insulin, D50, and dextrose was added to his fluids. Critical care is at  the bedside. They have elected to perform paracentesis for both symptomatic relief and to rule out SBP.  Paracentesis noted to be grossly bloody. Patient was typed and screened and given 4 units of FFP and administered 50 grams of albumin.  Merryl Hacker, MD 10/05/2013 1017  Merryl Hacker, MD 10/12/2013 [phone]

## 2013-09-29 NOTE — Progress Notes (Signed)
CRITICAL VALUE ALERT  Critical value received:  Troponin 1.24  Date of notification:  10/07/2013  Time of notification:  13:15  Critical value read back:Yes.    Nurse who received alert:  Willia Craze, RN  MD notified (1st page):  Dr. Elsworth Soho  Time of first page:  13:15  MD notified (2nd page): Dr. Elsworth Soho  Time of second page:13:30  Responding MD:  Dr. Elsworth Soho  Time MD responded:  13:35

## 2013-09-29 NOTE — ED Notes (Addendum)
Dr. Rupert Stacks at bedside for paracentesis. Family present at bedside as well.

## 2013-09-29 NOTE — ED Notes (Signed)
Patient daughter in law at beside at this time.

## 2013-09-30 DIAGNOSIS — R531 Weakness: Secondary | ICD-10-CM | POA: Diagnosis present

## 2013-09-30 DIAGNOSIS — N179 Acute kidney failure, unspecified: Secondary | ICD-10-CM

## 2013-09-30 DIAGNOSIS — K769 Liver disease, unspecified: Secondary | ICD-10-CM

## 2013-09-30 DIAGNOSIS — J9601 Acute respiratory failure with hypoxia: Secondary | ICD-10-CM | POA: Diagnosis present

## 2013-09-30 DIAGNOSIS — Z515 Encounter for palliative care: Secondary | ICD-10-CM

## 2013-09-30 DIAGNOSIS — Z66 Do not resuscitate: Secondary | ICD-10-CM | POA: Diagnosis present

## 2013-09-30 DIAGNOSIS — C787 Secondary malignant neoplasm of liver and intrahepatic bile duct: Secondary | ICD-10-CM | POA: Diagnosis present

## 2013-09-30 LAB — PREPARE FRESH FROZEN PLASMA
UNIT DIVISION: 0
UNIT DIVISION: 0
Unit division: 0
Unit division: 0

## 2013-09-30 LAB — BASIC METABOLIC PANEL
ANION GAP: 17 — AB (ref 5–15)
BUN: 67 mg/dL — ABNORMAL HIGH (ref 6–23)
CALCIUM: 9.4 mg/dL (ref 8.4–10.5)
CO2: 24 mEq/L (ref 19–32)
Chloride: 94 mEq/L — ABNORMAL LOW (ref 96–112)
Creatinine, Ser: 3.07 mg/dL — ABNORMAL HIGH (ref 0.50–1.35)
GFR calc non Af Amer: 21 mL/min — ABNORMAL LOW (ref >90)
GFR, EST AFRICAN AMERICAN: 25 mL/min — AB (ref >90)
Glucose, Bld: 139 mg/dL — ABNORMAL HIGH (ref 70–99)
Potassium: 4.6 mEq/L (ref 3.7–5.3)
Sodium: 135 mEq/L — ABNORMAL LOW (ref 137–147)

## 2013-09-30 LAB — GLUCOSE, CAPILLARY
GLUCOSE-CAPILLARY: 194 mg/dL — AB (ref 70–99)
GLUCOSE-CAPILLARY: 147 mg/dL — AB (ref 70–99)
Glucose-Capillary: 158 mg/dL — ABNORMAL HIGH (ref 70–99)
Glucose-Capillary: 135 mg/dL — ABNORMAL HIGH (ref 70–99)
Glucose-Capillary: 138 mg/dL — ABNORMAL HIGH (ref 70–99)
Glucose-Capillary: 124 mg/dL — ABNORMAL HIGH (ref 70–99)
Glucose-Capillary: 153 mg/dL — ABNORMAL HIGH (ref 70–99)

## 2013-09-30 LAB — CBC
HCT: 29.1 % — ABNORMAL LOW (ref 39.0–52.0)
Hemoglobin: 9.5 g/dL — ABNORMAL LOW (ref 13.0–17.0)
MCH: 38.2 pg — ABNORMAL HIGH (ref 26.0–34.0)
MCHC: 32.6 g/dL (ref 30.0–36.0)
MCV: 116.9 fL — ABNORMAL HIGH (ref 78.0–100.0)
Platelets: 16 10*3/uL — CL (ref 150–400)
RBC: 2.49 MIL/uL — ABNORMAL LOW (ref 4.22–5.81)
RDW: 19.4 % — AB (ref 11.5–15.5)
WBC: 12.2 10*3/uL — ABNORMAL HIGH (ref 4.0–10.5)

## 2013-09-30 LAB — LIPASE, BLOOD: LIPASE: 271 U/L — AB (ref 11–59)

## 2013-09-30 LAB — LACTIC ACID, PLASMA: LACTIC ACID, VENOUS: 4.7 mmol/L — AB (ref 0.5–2.2)

## 2013-09-30 MED ORDER — MORPHINE SULFATE 2 MG/ML IJ SOLN
1.0000 mg | INTRAMUSCULAR | Status: DC | PRN
  Administered 2013-09-30: 1 mg via INTRAVENOUS

## 2013-09-30 MED ORDER — MORPHINE SULFATE 2 MG/ML IJ SOLN
INTRAMUSCULAR | Status: AC
  Administered 2013-09-30: 1 mg via INTRAVENOUS
  Filled 2013-09-30: qty 1

## 2013-09-30 MED ORDER — PANTOPRAZOLE SODIUM 40 MG PO TBEC
40.0000 mg | DELAYED_RELEASE_TABLET | Freq: Every day | ORAL | Status: DC
  Filled 2013-09-30 (×2): qty 1

## 2013-09-30 NOTE — Progress Notes (Signed)
PULMONARY / CRITICAL CARE MEDICINE   Name: Todd Mcdowell MRN: [id number] DOB: 16-Sep-1955    ADMISSION DATE:  10/11/2013  REFERRING MD : ED  CHIEF COMPLAINT:  Dyspnea  INITIAL PRESENTATION:   STUDIES:    SIGNIFICANT EVENTS: Paracentesis 8/10 >> 7/25, 7/29 MRI  Brain [facility]) >> 3 new areas are restricted diffusion. Findings are most compatible with acute infarcts     HISTORY OF PRESENT ILLNESS:   58 yo with a history of cirrhosis, hepatitis C, and recent diagnosis of stage IV cancer (? Gall bladder primary)with metastases to liver and lungs and suspicion of either brain mets vs embolic CVA who presented with respiratory distress. EMS was called to his house for respiratory distress. He was originally seen at [facility] with this diagnosis and was sent home about 2 days ago. At home he has had progressive shortness of breath and jaundice. Per EMS, saturation was 65%. He was placed on a nonrebreather.In the ED he has multiple metabolic abnormality (high K, Cr, BUN, LFT, bili, INR), low plt, high lactate, acidotic, low glucose. He felt better after Paracentesis   Quit smoking already, used to be a heavy EtOH drinker but quit about a month ago, no known drug abuse.   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 Ha hgot a port on his chest for possible palliative chemo this week but now he's acutely ill. [name] also had a possible UTI in [facility]  SUBJECTIVE: denies pain, sitting up in bed eating breakfast Afebrile, Poor UO On [place]  VITAL SIGNS: Temp:  [96.2 F (35.7 C)-98.3 F (36.8 C)] 97.3 F (36.3 C) (08/11 0400) Pulse Rate:  [29-112] 102 (08/11 0700) Resp:  [10-24] 13 (08/11 0700) BP: (95-138)/(50-78) 103/75 mmHg (08/11 0700) SpO2:  [96 %-100 %] 96 % (08/11 0700) FiO2 (%):  [40 %] 40 % (08/10 0900) Weight:  [174 lb 9.7 oz  (79.2 kg)] 174 lb 9.7 oz (79.2 kg) (08/11 0400) HEMODYNAMICS:   VENTILATOR SETTINGS: Vent Mode:  [-]  FiO2 (%):  [40 %] 40 % INTAKE / OUTPUT:  Intake/Output Summary (Last 24 hours) at 09/30/13 0835 Last data filed at 09/30/13 0700  Gross per 24 hour  Intake 2927.91 ml  Output      0 ml  Net 2927.91 ml    PHYSICAL EXAMINATION: General:  Awake, alert,chronically ill appearing, jaundiced, in no distress Neuro: interactive,non focal, oriented x 3 ,asterexis+ HEENT: icterus+, EOM full and equal, trachea midline, no stridor Cardiovascular:  RRR, has grade 3/6 systolic murmur Lungs:  Clear, no wheeze, no rales Abdomen:  Globular, nontender, no guarding, has fluid wave Musculoskeletal:  No gross deformities, has bilateral grade 2 leg edema Skin:  Has multiple ecchymosis, has jaundice  LABS:  CBC  Recent Labs Lab 09/21/2013 0112 09/30/2013 0525 09/30/13 0550  WBC 11.8* 11.0* 12.2*  HGB 12.9* 10.8* 9.5*  HCT 38.5* 32.7* 29.1*  PLT 57* 34* 16*   Coag's  Recent Labs Lab 10/02/2013 0112  INR 4.51*   BMET  Recent Labs Lab 09/21/2013 0525 10/02/2013 1115 09/30/13 0550  NA 133* 131* 135*  K 6.7* 6.0* 4.6  CL 89* 89* 94*  CO2 12* 18* 24  BUN 60* 61* 67*  CREATININE 2.06* 2.30* 3.07*  GLUCOSE 106* 156* 139*   Electrolytes  Recent Labs Lab 10/12/2013 0525 10/07/2013 1115 09/30/13 0550  CALCIUM  9.7 9.8 9.4  MG 3.3*  --   --   PHOS 7.0*  --   --    Sepsis Markers  Recent Labs Lab 10/10/2013 0123 10/14/2013 1115 09/30/13 0550  LATICACIDVEN 16.75* 11.1* 4.7*   ABG  Recent Labs Lab 09/21/2013 0112  PHART 7.303*  PCO2ART 26.3*  PO2ART 326.0*   Liver Enzymes  Recent Labs Lab 10/14/2013 0112 10/16/2013 0525  AST 1437* 1866*  ALT 503* 595*  ALKPHOS 194* 147*  BILITOT 18.3* 17.8*  ALBUMIN 2.5* 3.1*   Cardiac Enzymes  Recent Labs Lab 10/19/2013 0525 10/13/2013 1115 09/26/2013 2014  TROPONINI 0.63* 1.24* 1.49*   Glucose  Recent Labs Lab 09/23/2013 0301  10/17/2013 0637 10/14/2013 0804 10/09/2013 1300 09/20/2013 1600 10/14/2013 1953  GLUCAP 140* 135* 108* 159* 194* 191*    Imaging Dg Chest Portable 1 View  10/09/2013   CLINICAL DATA:  Respiratory distress.  EXAM: PORTABLE CHEST - 1 VIEW  COMPARISON:  Chest radiograph performed 09/25/2013  FINDINGS: The lungs remain mildly hypoexpanded. Mild vascular crowding and vascular congestion are seen. No pleural effusion or pneumothorax is seen.  The cardiomediastinal silhouette is normal in size. A left-sided chest port is noted ending about the distal SVC. No acute osseous abnormalities are identified.  IMPRESSION: Lungs mildly hypoexpanded. Mild vascular congestion seen. No evidence of focal airspace consolidation.   Electronically Signed   By: Garald Balding M.D.   On: 10/05/2013 01:46     ASSESSMENT / PLAN:  PULMONARY A: dyspnea + hypoxia due to restriction by ascites, ? Underlying COPD, ? Hepatopulmonary syndrome Doubt new PE/DVT since auto anticoagaulated. 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 Given FFP to correct INR Pain control IV protonix for SUP  HEMATOLOGIC A:  Low platelet likely related to cancer + cirrhosis P:  Transfuse plts if < 10k or active bleed No heparins for now, no antiplatelet  INFECTIOUS A:  Possible SBP. Less likely UTI or PNA but cannot be ruled out at the moment P:   BCx2 pending UC pending Sputum Abx: Meropenem 8/10 >>> Zosyn x 1 dose in ED  8/10 Vanco x 1 dose in ED 8/10  ENDOCRINE A:  Low glucose likely from liver failure. Possibly from sepsis P:   CBGs  NEUROLOGIC A:  Confusion likely from hepatic encephalopathy, may be brain mets contributing if present Recent infarcts noted on MRI P:   Start lactulose -titrate to 2-3 BMs/day   I had an extensive discussion with his son Ysidro Evert and explained the overall very poor prognosis. DNR issued ,no dailysis, if worsens full comfort  TODAY'S SUMMARY: Would take palliative approach in this man with metastatic cancer, he certainly is not a candidate for HD or chemotherapy at this time. Transfer to floor & triad to pick up 8/12  I have personally obtained a history, examined the patient, evaluated laboratory and imaging results, formulated the assessment and plan and placed orders.    Kara Mead  MD. FCCP. [facility] Pager [phone] If no response call [phone]   09/30/2013, 8:35 AM

## 2013-09-30 NOTE — Consult Note (Signed)
I have reviewed and discussed case with Nurse Practitioner And agree with documentation and plan as noted above   Norie Latendresse J. Harlin Mazzoni D.O.  Palliative Medicine Team at [place]  Team Phone: [phone]    

## 2013-09-30 NOTE — Progress Notes (Signed)
Transfer details discussed with Dr. Hartford Poli.  [facility] to assume care as of am 8/12.     Noe Gens, NP-C [place] [facility] Pgr: [phone] or [phone]

## 2013-09-30 NOTE — Progress Notes (Signed)
09/30/13 1000  Clinical Encounter Type  Visited With Family  Visit Type Spiritual support;Social support  Referral From Other (Comment);Chaplain Carlis Stable, RD)  Spiritual Encounters  Spiritual Needs Emotional  Stress Factors  Family Stress Factors Major life changes (caring for children in waiting room)   Late entry.  Provided coloring sheets, crayons, tangrams game, and other items for children to support family in coping with anticipatory grief and caring for children in hospital setting.  Family appreciative of support and aware of ongoing chaplain availability.  Please page [phone] as further needs arise.  Thank you.  [place], [place]

## 2013-09-30 NOTE — Progress Notes (Signed)
Progress Note from the Palliative Medicine Team at [place]:   -continued conversation with two sons and his brother Elta Guadeloupe, lives in [place]) via telephone regarding diagnosis and prognosis, [place], options.   -discussed in detail the natural trajectory and expectations at EOL   -all verbalizing understanding of overall poor prognosis, but continue to desire all offered and available medical interventions to prolong life.    -strongly  recommended comfort as the focus of care and discussed concept of futility and "just because we can doesn't mean we should"      Objective: No Known Allergies Scheduled Meds: . antiseptic oral rinse  7 mL Mouth Rinse BID  . chlorhexidine  15 mL Mouth Rinse BID  . feeding supplement (ENSURE COMPLETE)  237 mL Oral TID BM  . lactulose  20 g Oral Daily  . meropenem (MERREM) IV  1 g Intravenous Q12H  . pantoprazole  40 mg Oral Q1200  . sodium chloride  10-40 mL Intracatheter Q12H   Continuous Infusions:  PRN Meds:.sodium chloride, fentaNYL, ondansetron (ZOFRAN) IV, oxyCODONE, sodium chloride  BP 107/53  Pulse 109  Temp(Src) 97.7 F (36.5 C) (Oral)  Resp 14  Ht 5\' 10"  (1.778 m)  Wt 79.2 kg (174 lb 9.7 oz)  BMI 25.05 kg/m2  SpO2 92%   PPS:30 % at best  Pain Score:denies   Intake/Output Summary (Last 24 hours) at 09/30/13 1332 Last data filed at 09/30/13 0946  Gross per 24 hour  Intake 2671.25 ml  Output      0 ml  Net 2671.25 ml       Physical Exam:  General: ill appearing, NAD Skin: more jaundice today, warm and dry HEENT:  + temporal muscle wasting Chest:   decreased in bases CVS: tachycardic Abdomen: distended, firm, non-tender Ext: BLE +2 edema Neuro: lethargic and intermittently confused unable to make EOL decisions for himself at this time  Labs: CBC    Component Value Date/Time   WBC 12.2* 09/30/2013 0550   RBC 2.49* 09/30/2013 0550   HGB 9.5* 09/30/2013 0550   HCT 29.1* 09/30/2013 0550   PLT 16* 09/30/2013 0550   MCV 116.9* 09/30/2013 0550   MCH 38.2* 09/30/2013 0550   MCHC 32.6 09/30/2013 0550   RDW 19.4* 09/30/2013 0550   LYMPHSABS 0.7 09/27/2013 0112   MONOABS 1.7* 10/04/2013 0112   EOSABS 0.0 10/08/2013 0112   BASOSABS 0.1 09/22/2013 0112    BMET    Component Value Date/Time   NA 135* 09/30/2013 0550   K 4.6 09/30/2013 0550   CL 94* 09/30/2013 0550   CO2 24 09/30/2013 0550   GLUCOSE 139* 09/30/2013 0550   BUN 67* 09/30/2013 0550   CREATININE 3.07* 09/30/2013 0550   CALCIUM 9.4 09/30/2013 0550   GFRNONAA 21* 09/30/2013 0550   GFRAA 25* 09/30/2013 0550    CMP     Component Value Date/Time   NA 135* 09/30/2013 0550   K 4.6 09/30/2013 0550   CL 94* 09/30/2013 0550   CO2 24 09/30/2013 0550   GLUCOSE 139* 09/30/2013 0550   BUN 67* 09/30/2013 0550   CREATININE 3.07* 09/30/2013 0550   CALCIUM 9.4 09/30/2013 0550   PROT 6.8 10/16/2013 0525   ALBUMIN 3.1* 10/15/2013 0525   AST 1866* 10/02/2013 0525   ALT 595* 10/13/2013 0525   ALKPHOS 147* 10/07/2013 0525   BILITOT 17.8* 09/24/2013 0525   GFRNONAA 21* 09/30/2013 0550   GFRAA 25* 09/30/2013 0550     Assessment and Plan: 1. Code  Status:DNR/DNI           Family is requesting continued treatments to prolong life.  The idea of shifting to a full comfort path feels like "giving up".  We discussed and processed the concept of acceptance when one comes to terms with mortality and the strength in that acceptance.  2. Symptom Control:      Weakness/Failure to thrive  3. Psycho/Social:  Emotional support offered to family, entire conversation charged with loud outburst and cussing.  Son Legrand Como clearly states his boundaries as far as inability to care for his father at home, son Ysidro Evert works full-time and is expecting ac child any day.   A discharge home is unlikely   4. Spiritual   Chaplain consulted and [place] priest contacted   5. Disposition:  Dependant on outcomes, discussed home with hospice vs inpatient hospice facility vs SNF.           I believe with a  full comfort path this patient is eligible for an inpatient hospice facility with a prognosis of less than three weeks.  Family is not in that mind set today.   Patient Documents Completed or Given: Document Given Completed  Advanced Directives Pkt    MOST    DNR    Gone from My Sight    Hard Choices yes     Time In Time Out Total Time Spent with Patient Total Overall Time  1430 1530 60 min 60 min    Greater than 50%  of this time was spent counseling and coordinating care related to the above assessment and plan.  Wadie Lessen NP  Palliative Medicine Team Team Phone # [phone] Pager [phone]  Discussed with Sallye Ober NP 1

## 2013-09-30 NOTE — Progress Notes (Signed)
Chaplain arranged for priest from [place] [facility] at family's request.    Provided support at bedside with family.  Will continue to follow for support.  Pt's son concerned that someone can be with pt if pt is dying.  Chaplain encouraged son to have nursing page spiritual care if chaplain is needed.

## 2013-09-30 NOTE — Progress Notes (Signed)
eLink Physician-Brief Progress Note Patient Name: Todd Mcdowell DOB: [birth year] MRN: [id number]  Date of Service  09/30/2013   HPI/Events of Note   Cancer pain.  Feeling nauseous and can't take oral pain meds.  eICU Interventions   Will order prn morphine IV.   Intervention Category Major Interventions: Other:  Jaquise Faux 09/30/2013, 3:36 PM

## 2013-10-01 DIAGNOSIS — C801 Malignant (primary) neoplasm, unspecified: Secondary | ICD-10-CM

## 2013-10-01 DIAGNOSIS — A419 Sepsis, unspecified organism: Principal | ICD-10-CM

## 2013-10-01 DIAGNOSIS — R6521 Severe sepsis with septic shock: Secondary | ICD-10-CM

## 2013-10-01 DIAGNOSIS — J9600 Acute respiratory failure, unspecified whether with hypoxia or hypercapnia: Secondary | ICD-10-CM

## 2013-10-01 DIAGNOSIS — R6520 Severe sepsis without septic shock: Secondary | ICD-10-CM

## 2013-10-01 LAB — COMPREHENSIVE METABOLIC PANEL
ALBUMIN: 2.4 g/dL — AB (ref 3.5–5.2)
ALK PHOS: 649 U/L — AB (ref 39–117)
ALT: 3067 U/L — ABNORMAL HIGH (ref 0–53)
AST: 7928 U/L — ABNORMAL HIGH (ref 0–37)
Anion gap: 21 — ABNORMAL HIGH (ref 5–15)
BILIRUBIN TOTAL: 23.5 mg/dL — AB (ref 0.3–1.2)
BUN: 69 mg/dL — ABNORMAL HIGH (ref 6–23)
CO2: 20 mEq/L (ref 19–32)
CREATININE: 4.28 mg/dL — AB (ref 0.50–1.35)
Calcium: 9.5 mg/dL (ref 8.4–10.5)
Chloride: 94 mEq/L — ABNORMAL LOW (ref 96–112)
GFR calc Af Amer: 16 mL/min — ABNORMAL LOW (ref >90)
GFR calc non Af Amer: 14 mL/min — ABNORMAL LOW (ref >90)
GLUCOSE: 140 mg/dL — AB (ref 70–99)
POTASSIUM: 5.2 meq/L (ref 3.7–5.3)
Sodium: 135 mEq/L — ABNORMAL LOW (ref 137–147)
Total Protein: 5.5 g/dL — ABNORMAL LOW (ref 6.0–8.3)

## 2013-10-01 LAB — URINALYSIS, ROUTINE W REFLEX MICROSCOPIC
Glucose, UA: NEGATIVE mg/dL
Ketones, ur: 15 mg/dL — AB
Nitrite: POSITIVE — AB
Protein, ur: 30 mg/dL — AB
SPECIFIC GRAVITY, URINE: 1.025 (ref 1.005–1.030)
UROBILINOGEN UA: 1 mg/dL (ref 0.0–1.0)
pH: 5 (ref 5.0–8.0)

## 2013-10-01 LAB — URINE MICROSCOPIC-ADD ON

## 2013-10-01 LAB — GLUCOSE, CAPILLARY
GLUCOSE-CAPILLARY: 24 mg/dL — AB (ref 70–99)
GLUCOSE-CAPILLARY: 27 mg/dL — AB (ref 70–99)
GLUCOSE-CAPILLARY: 87 mg/dL (ref 70–99)
Glucose-Capillary: 55 mg/dL — ABNORMAL LOW (ref 70–99)
Glucose-Capillary: 76 mg/dL (ref 70–99)

## 2013-10-01 LAB — CBC
HEMATOCRIT: 28.8 % — AB (ref 39.0–52.0)
Hemoglobin: 9.5 g/dL — ABNORMAL LOW (ref 13.0–17.0)
MCH: 38.2 pg — ABNORMAL HIGH (ref 26.0–34.0)
MCHC: 33 g/dL (ref 30.0–36.0)
MCV: 115.7 fL — ABNORMAL HIGH (ref 78.0–100.0)
PLATELETS: 22 10*3/uL — AB (ref 150–400)
RBC: 2.49 MIL/uL — ABNORMAL LOW (ref 4.22–5.81)
RDW: 19.7 % — AB (ref 11.5–15.5)
WBC: 19 10*3/uL — ABNORMAL HIGH (ref 4.0–10.5)

## 2013-10-01 LAB — CREATININE, URINE, RANDOM: Creatinine, Urine: 170.11 mg/dL

## 2013-10-01 LAB — NA AND K (SODIUM & POTASSIUM), RAND UR: Potassium Urine: 49 mEq/L

## 2013-10-01 MED ORDER — DEXTROSE 5 % IV SOLN
INTRAVENOUS | Status: DC
  Administered 2013-10-01: 12:00:00 via INTRAVENOUS

## 2013-10-01 MED ORDER — LIP MEDEX EX OINT
TOPICAL_OINTMENT | CUTANEOUS | Status: AC
  Filled 2013-10-01: qty 7

## 2013-10-01 MED ORDER — DEXTROSE 50 % IV SOLN
25.0000 mL | Freq: Once | INTRAVENOUS | Status: AC | PRN
  Administered 2013-10-01: 25 mL via INTRAVENOUS

## 2013-10-01 MED ORDER — DEXTROSE 50 % IV SOLN: 25.0000 mL | Freq: Once | INTRAVENOUS | Status: DC | PRN

## 2013-10-01 MED ORDER — SODIUM CHLORIDE 0.9 % IV SOLN
500.0000 mg | Freq: Two times a day (BID) | INTRAVENOUS | Status: DC
  Filled 2013-10-01: qty 0.5

## 2013-10-01 MED ORDER — DEXTROSE 50 % IV SOLN
INTRAVENOUS | Status: AC
  Administered 2013-10-01: 50 mL
  Filled 2013-10-01: qty 50

## 2013-10-02 LAB — URINE CULTURE
Colony Count: NO GROWTH
Culture: NO GROWTH

## 2013-10-04 LAB — CULTURE, BODY FLUID W GRAM STAIN -BOTTLE

## 2013-10-04 LAB — CULTURE, BODY FLUID-BOTTLE: CULTURE: NO GROWTH

## 2013-10-05 LAB — CULTURE, BLOOD (ROUTINE X 2): CULTURE: NO GROWTH

## 2013-10-06 LAB — CULTURE, BLOOD (ROUTINE X 2)

## 2013-10-21 NOTE — Discharge Summary (Addendum)
Physician Discharge Summary  Miroslav Gin GYJ:[id number] DOB: [birth year] DOA: 10/11/2013  PCP: No primary provider on file.  Admit date: 10/03/2013 Discharge date: 2013-10-31  Time spent: 35  minutes  Discharge Diagnoses:   Principle problem  acute hypoxic respiratory failure  Active Problems:   Liver failure   Protein-calorie malnutrition, severe   AKI (acute kidney injury)   Metastatic cancer to liver   DNR (do not resuscitate)   Palliative care encounter   Weakness generalized   Adenocarcinoma of unknown origin   Sepsis   Discharge Condition: patient expired on 10-31-2013    Filed Weights   09/30/13 0400 09/30/13 1042 October 31, 2013 0500  Weight: 79.2 kg (174 lb 9.7 oz) 79.2 kg (174 lb 9.7 oz) 78.2 kg (172 lb 6.4 oz)    History of present illness:  58 yo with a history of cirrhosis, hepatitis C, and recent diagnosis of stage IV cancer with metastases to liver and lungs and suspicion of either brain mets vs embolic CVA who presented with respiratory distress. EMS was called to his house for respiratory distress. He was originally seen at [facility] with this diagnosis and was sent home about 2 days ago. At home he has had progressive shortness of breath and jaundice. Per EMS, saturation was 65%. He was placed on a nonrebreather. He denies any chest pain, abdominal pain. No known recent fevers. In the ED he had high K, Cr, BUN, LFT, bili, INR,  low plt, high lactate, acidotic, low glucose.  Paracentesis done  in the ED after which  he felt better after that but about 15 minutes later was complaining of some chest pain, saturation came down from 94% to 92% on room air.   He was found to have poorly differentiated adenoCA in [facility] hospital (possibly gall bladder origin) with extensive liver mets, a few lung mets, and question of brain mets vs embolic stroke. He had  a port A cath on his chest for possible palliative chemo on the week of admission.  Patient admitted to ICU given  hypoxic resp failure, sepsis and acute liver failure.  Hospital Course:  Acute hypoxic respiratory failure and associated sepsis possibly due to ascites, underlying COPD vs hepatopulmonary syndrome  Patient met criteria for sepsis based on his elevated  HR, low BP , RR and elevated lactic acid. Patient although maintained sats on [place] but progressively declined. No signs of PNA or suspicion for PE  NSTEMI  pt reported chest pain on admission with  elevated troponin. Possibly demand ischemia Given acute illness, no aggressive measures were done  Adenocarcinoma of unknown origIn  possibly gall bladder origin with mets to liver , lungs and possible brai mets  patient aggressively  Declined in mental status with worsening AKI and liver function. Paracentesis done and FFP given to correct INR Palliative care was consulted to discuss goals of care . Family had conflict regarding hospice vs aggressive care. Given is acute rapid decline in function , comfort care was recommended. A family meeting was rearrange for today but patient passed away prior to that.   Thrombocytopenia secondary to liver failure and malignancy  ? SBP Was placed on empiric abx. cx negative  Hypoglycemia  possibly due to liver mets  WAS placed on D5  Hepatic encephalopathy   patient was pronounced dead on 10/31/2013 at 15:30           Procedures:  Paracentesis    Consultations:  PCCM  palliative care  Discharge Exam: Filed Vitals:  10/03/13 0503  BP: 96/60  Pulse: 111  Temp: 97.6 F (36.4 C)  Resp: 18     Discharge Instructions You were cared for by a hospitalist during your hospital stay. If you have any questions about your discharge medications or the care you received while you were in the hospital after you are discharged, you can call the unit and asked to speak with the hospitalist on call if the hospitalist that took care of you is not available. Once you are discharged, your  primary care physician will handle any further medical issues. Please note that NO REFILLS for any discharge medications will be authorized once you are discharged, as it is imperative that you return to your primary care physician (or establish a relationship with a primary care physician if you do not have one) for your aftercare needs so that they can reassess your need for medications and monitor your lab values.     Medication List                              No Known Allergies    The results of significant diagnostics from this hospitalization (including imaging, microbiology, ancillary and laboratory) are listed below for reference.    Significant Diagnostic Studies: Dg Chest Portable 1 View  10/07/2013   CLINICAL DATA:  Respiratory distress.  EXAM: PORTABLE CHEST - 1 VIEW  COMPARISON:  Chest radiograph performed 09/25/2013  FINDINGS: The lungs remain mildly hypoexpanded. Mild vascular crowding and vascular congestion are seen. No pleural effusion or pneumothorax is seen.  The cardiomediastinal silhouette is normal in size. A left-sided chest port is noted ending about the distal SVC. No acute osseous abnormalities are identified.  IMPRESSION: Lungs mildly hypoexpanded. Mild vascular congestion seen. No evidence of focal airspace consolidation.   Electronically Signed   By: Garald Balding M.D.   On: 10/12/2013 01:46    Microbiology: Recent Results (from the past 240 hour(s))  CULTURE, BLOOD (ROUTINE X 2)     Status: None   Collection Time    10/12/2013  1:12 AM      Result Value Ref Range Status   Specimen Description BLOOD LEFT ANTECUBITAL   Final   Special Requests BOTTLES DRAWN AEROBIC AND ANAEROBIC 5CC   Final   Culture  Setup Time     Final   Value: 10/08/2013 09:50     Performed at [facility]   Culture     Final   Value:        BLOOD CULTURE RECEIVED NO GROWTH TO DATE CULTURE WILL BE HELD FOR 5 DAYS BEFORE ISSUING A FINAL NEGATIVE REPORT     Performed at  [facility]   Report Status PENDING   Incomplete  CULTURE, BLOOD (ROUTINE X 2)     Status: None   Collection Time    09/26/2013  1:12 AM      Result Value Ref Range Status   Specimen Description BLOOD RIGHT FOREARM   Final   Special Requests BOTTLES DRAWN AEROBIC AND ANAEROBIC 6CC   Final   Culture  Setup Time     Final   Value: 09/20/2013 09:50     Performed at [facility]   Culture     Final   Value:        BLOOD CULTURE RECEIVED NO GROWTH TO DATE CULTURE WILL BE HELD FOR 5 DAYS BEFORE ISSUING A FINAL NEGATIVE REPORT  Performed at [facility]   Report Status PENDING   Incomplete  CULTURE, BODY FLUID-BOTTLE     Status: None   Collection Time    10/11/2013  3:42 AM      Result Value Ref Range Status   Specimen Description PARACENTESIS   Final   Special Requests NONE   Final   Culture     Final   Value: NO GROWTH 1 DAY     Performed at [facility]   Report Status PENDING   Incomplete  MRSA PCR SCREENING     Status: None   Collection Time    09/22/2013  5:01 AM      Result Value Ref Range Status   MRSA by PCR NEGATIVE  NEGATIVE Final   Comment:            The GeneXpert MRSA Assay (FDA     approved for NASAL specimens     only), is one component of a     comprehensive MRSA colonization     surveillance program. It is not     intended to diagnose MRSA     infection nor to guide or     monitor treatment for     MRSA infections.     Labs: Basic Metabolic Panel:  Recent Labs Lab 10/03/2013 0112 09/28/2013 0525 10/02/2013 1115 09/30/13 0550 2013/10/17 0354  NA 132* 133* 131* 135* 135*  K 6.9* 6.7* 6.0* 4.6 5.2  CL 85* 89* 89* 94* 94*  CO2 14* 12* 18* 24 20  GLUCOSE 32* 106* 156* 139* 140*  BUN 61* 60* 61* 67* 69*  CREATININE 1.96* 2.06* 2.30* 3.07* 4.28*  CALCIUM 10.2 9.7 9.8 9.4 9.5  MG  --  3.3*  --   --   --   PHOS  --  7.0*  --   --   --    Liver Function Tests:  Recent Labs Lab 10/07/2013 0112 09/23/2013 0525 2013-10-17 0354  AST  1437* 1866* 7928*  ALT 503* 595* 3067*  ALKPHOS 194* 147* 649*  BILITOT 18.3* 17.8* 23.5*  PROT 7.6 6.8 5.5*  ALBUMIN 2.5* 3.1* 2.4*    Recent Labs Lab 09/30/13 0550  LIPASE 271*    Recent Labs Lab 09/20/2013 0525  AMMONIA 82*   CBC:  Recent Labs Lab 09/30/2013 0112 09/21/2013 0525 09/30/13 0550 10-17-2013 0354  WBC 11.8* 11.0* 12.2* 19.0*  NEUTROABS 9.3*  --   --   --   HGB 12.9* 10.8* 9.5* 9.5*  HCT 38.5* 32.7* 29.1* 28.8*  MCV 113.6* 114.7* 116.9* 115.7*  PLT 57* 34* 16* 22*   Cardiac Enzymes:  Recent Labs Lab 09/30/2013 0525 10/11/2013 1115 09/22/2013 2014  TROPONINI 0.63* 1.24* 1.49*   BNP: BNP (last 3 results) No results found for this basename: PROBNP,  in the last 8760 hours CBG:  Recent Labs Lab 17-Oct-2013 0816 2013-10-17 0846 10/17/13 0907 10/17/13 1203 10/17/13 1221  GLUCAP 24* 55* 76 27* 87       Signed:  Saphira Lahmann  [facility] 17-Oct-2013, 4:18 PM

## 2013-10-21 NOTE — Progress Notes (Signed)
Hypoglycemic Event  CBG: 24  Treatment: D50 IV 25 mL  Symptoms: None  Follow-up CBG: Time:0846 CBG Result:55  Possible Reasons for Event: Inadequate meal intake  Comments/MD notified: Dr. Clementeen Graham made aware. Next follow up CBG 76 @ 0907    Wilkie Aye Dionne  Remember to initiate Hypoglycemia Order Set & complete

## 2013-10-21 NOTE — Progress Notes (Signed)
Patient's son requested chaplain for prayer as patient is declining. Came and prayed at bedside. Supported family with grief support.   Epifania Gore, PhD, [place]

## 2013-10-21 NOTE — Progress Notes (Signed)
CRITICAL VALUE ALERT  Critical value received:  Bilirubin 23.5  Date of notification:  31-Oct-2013  Time of notification:  0502  Critical value read back: yes  Nurse who received alert: Kennyth Lose  MD notified (1st page):  Titus Mould  Time of first page:  0510  MD notified (2nd page):  Time of second page:  Responding MD:  [email or web address]  Time MD responded:  [id number], no new orders

## 2013-10-21 NOTE — Progress Notes (Signed)
    Patient breathless, pulseless, pupils fixed and dilated.  Wadie Lessen NP   # [phone]

## 2013-10-21 NOTE — Progress Notes (Signed)
ANTIBIOTIC CONSULT NOTE - Follow up  Pharmacy Consult for Meropenem Indication: Intra-abdominal infection  No Known Allergies  Patient Measurements: Height: 5\' 10"  (177.8 cm) Weight: 172 lb 6.4 oz (78.2 kg) IBW/kg (Calculated) : 73   Vital Signs: Temp: 97.6 F (36.4 C) (08/12 0503) Temp src: Oral (08/12 0503) BP: 96/60 mmHg (08/12 0503) Pulse Rate: 111 (08/12 0503) Intake/Output from previous day: 08/11 0701 - 08/12 0700 In: 374.2 [I.V.:274.2; IV Piggyback:100] Out: 150 [Urine:150] Intake/Output from this shift:    Labs:  Recent Labs  10/08/2013 0525 10/05/2013 1115 09/30/13 0550 22-Oct-2013 0354  WBC 11.0*  --  12.2* 19.0*  HGB 10.8*  --  9.5* 9.5*  PLT 34*  --  16* 22*  CREATININE 2.06* 2.30* 3.07* 4.28*   Estimated Creatinine Clearance: 19.9 ml/min (by C-G formula based on Cr of 4.28). No results found for this basename: VANCOTROUGH, VANCOPEAK, VANCORANDOM, GENTTROUGH, GENTPEAK, GENTRANDOM, TOBRATROUGH, TOBRAPEAK, TOBRARND, AMIKACINPEAK, AMIKACINTROU, AMIKACIN,  in the last 72 hours   Microbiology: Recent Results (from the past 720 hour(s))  CULTURE, BLOOD (ROUTINE X 2)     Status: None   Collection Time    10/02/2013  1:12 AM      Result Value Ref Range Status   Specimen Description BLOOD LEFT ANTECUBITAL   Final   Special Requests BOTTLES DRAWN AEROBIC AND ANAEROBIC 5CC   Final   Culture  Setup Time     Final   Value: 10/04/2013 09:50     Performed at [facility]   Culture     Final   Value:        BLOOD CULTURE RECEIVED NO GROWTH TO DATE CULTURE WILL BE HELD FOR 5 DAYS BEFORE ISSUING A FINAL NEGATIVE REPORT     Performed at [facility]   Report Status PENDING   Incomplete  CULTURE, BLOOD (ROUTINE X 2)     Status: None   Collection Time    09/27/2013  1:12 AM      Result Value Ref Range Status   Specimen Description BLOOD RIGHT FOREARM   Final   Special Requests BOTTLES DRAWN AEROBIC AND ANAEROBIC [place]   Final   Culture  Setup Time     Final    Value: 10/19/2013 09:50     Performed at [facility]   Culture     Final   Value:        BLOOD CULTURE RECEIVED NO GROWTH TO DATE CULTURE WILL BE HELD FOR 5 DAYS BEFORE ISSUING A FINAL NEGATIVE REPORT     Performed at [facility]   Report Status PENDING   Incomplete  CULTURE, BODY FLUID-BOTTLE     Status: None   Collection Time    09/21/2013  3:42 AM      Result Value Ref Range Status   Specimen Description PARACENTESIS   Final   Special Requests NONE   Final   Culture     Final   Value: NO GROWTH 1 DAY     Performed at [facility]   Report Status PENDING   Incomplete  MRSA PCR SCREENING     Status: None   Collection Time    10/11/2013  5:01 AM      Result Value Ref Range Status   MRSA by PCR NEGATIVE  NEGATIVE Final   Comment:            The GeneXpert MRSA Assay (FDA     approved for NASAL specimens  only), is one component of a     comprehensive MRSA colonization     surveillance program. It is not     intended to diagnose MRSA     infection nor to guide or     monitor treatment for     MRSA infections.   Assessment: 62 yoM with a hx of cirrhosis, hepatitis C, and recent diagnosis of stage IV cancer with metastases to liver who presents with respiratory distress. EMS was called to his house for respiratory distress. He was originally seen at [facility] with this diagnosis. He has had progressive shortness of breath and jaundice. Meropenem for intra-abdominal infection  8/10 >> Zosyn x1 ED 8/10 >> Vanc x1 ED 8/10 >> Meropenem >>  Tmax: afebrile WBCs: elev and rising Renal: SCr cont to rise, up significantly overnight, 20CG, 19N  8/10 MRSA PCR: neg 8/10 blood x2: ngtd 8/10 paracentesis: ngtd  Goal of Therapy:  Appropriate antibiotic dosing for renal function; eradication of infection  Plan:   Decrease Meropenem from 1g q12h to 500mg  q12h d/t rising SCr.  F/u daily.  Romeo Rabon, PharmD, pager [phone]. 10/02/13,8:28 AM.

## 2013-10-21 NOTE — Progress Notes (Signed)
Pt bladder scanned, urine volume 379mL. On call notified, new orders given.

## 2013-10-21 NOTE — Progress Notes (Signed)
Hypoglycemic Event  CBG: 27  Treatment: D50 IV 25 mL  Symptoms: None  Follow-up CBG: Time:1221 CBG Result:87  Possible Reasons for Event: Inadequate meal intake  Comments/MD notified:D5 ivf hung at [place]  Remember to initiate Hypoglycemia Order Set & complete

## 2013-10-21 NOTE — Progress Notes (Signed)
Pt pulseless and without respiration. Verified with Wadie Lessen, NP. MD notified. Son at bedside.

## 2013-10-21 DEATH — deceased

## 2016-02-19 IMAGING — CR DG CHEST 1V PORT
1 series · 1 of 1 positions shown · non-contrast
Comparison: Chest radiograph performed 09/25/2013

CLINICAL DATA: Respiratory distress.

EXAM:
PORTABLE CHEST - 1 VIEW

[AP]
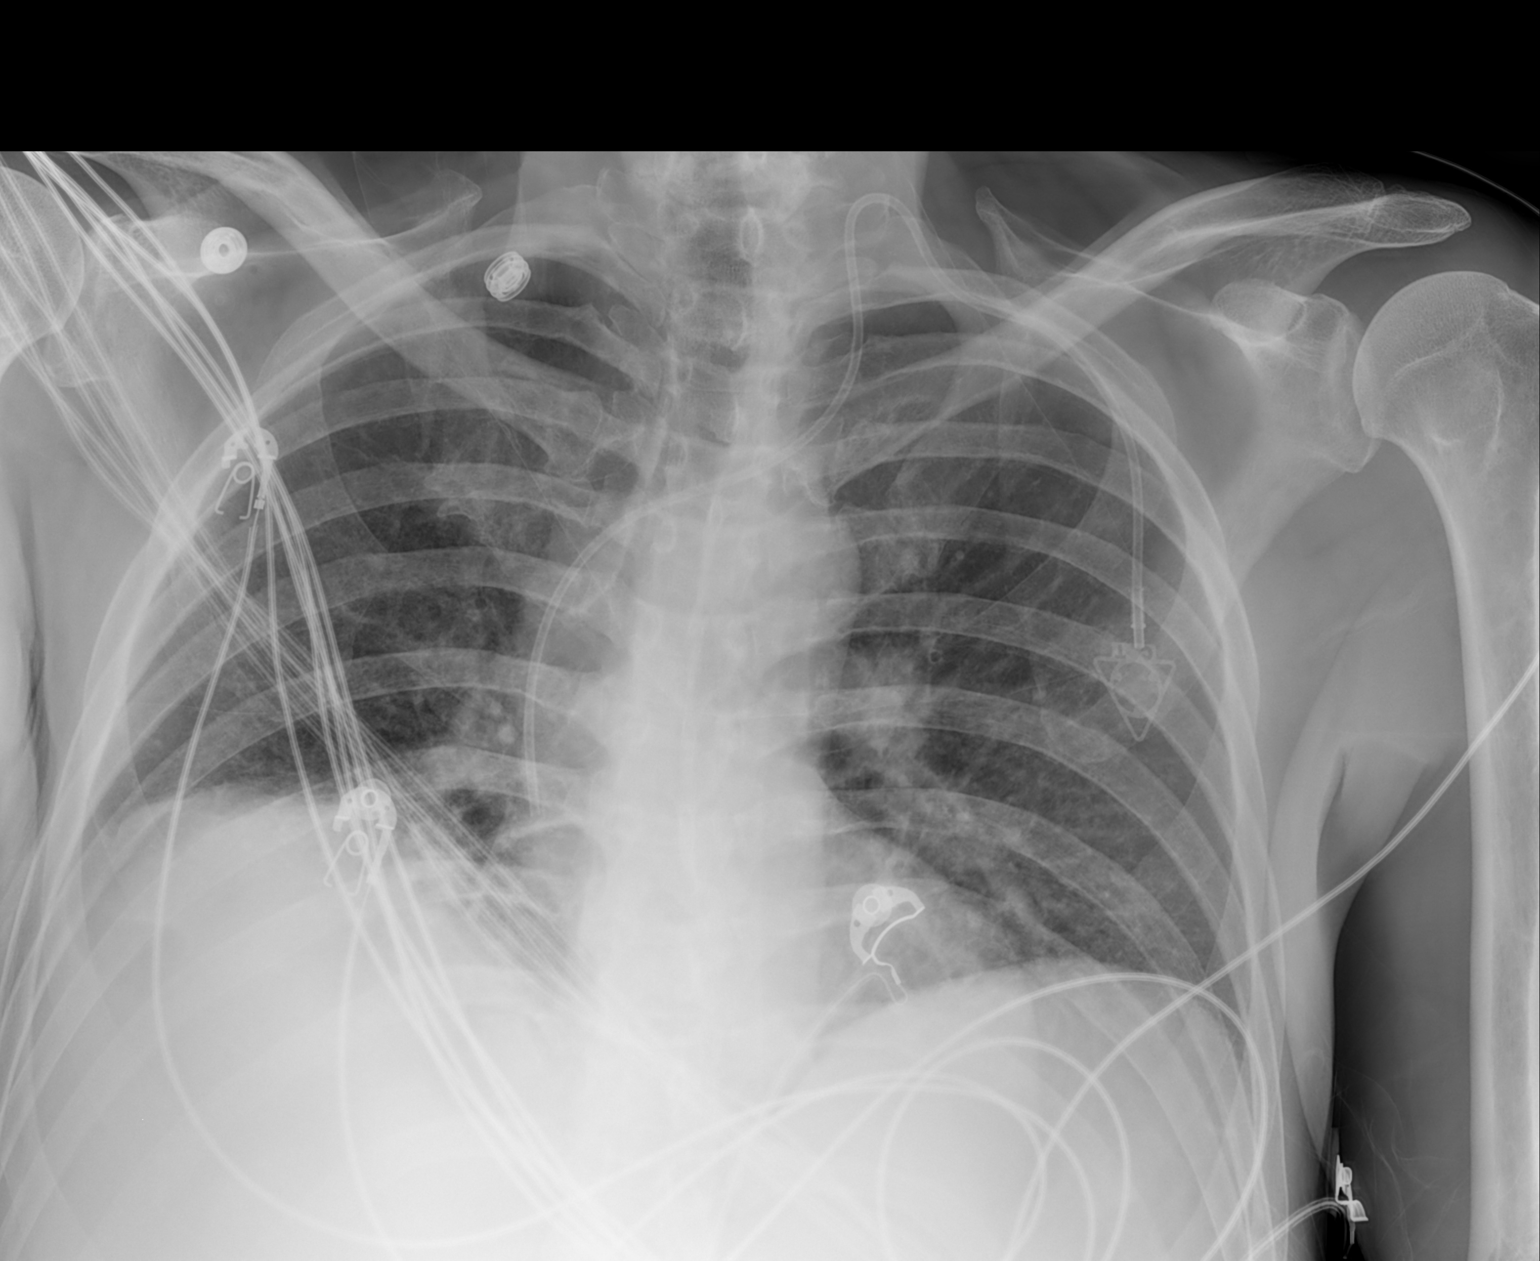

[1 of 1 positions shown; findings below may reference images not displayed]

FINDINGS: The lungs remain mildly hypoexpanded. Mild vascular crowding and
vascular congestion are seen. No pleural effusion or pneumothorax is
seen.

The cardiomediastinal silhouette is normal in size. A left-sided
chest port is noted ending about the distal SVC. No acute osseous
abnormalities are identified.
IMPRESSION: Lungs mildly hypoexpanded. Mild vascular congestion seen. No
evidence of focal airspace consolidation.
